# Patient Record
Sex: Female | Born: 1940 | Hispanic: No | Marital: Married | Smoking: Never smoker
Health system: Southern US, Community
[De-identification: ages and names within clinical notes are randomized; demographics above are authoritative.]

## PROBLEM LIST (undated history)

## (undated) DIAGNOSIS — I82409 Acute embolism and thrombosis of unspecified deep veins of unspecified lower extremity: Secondary | ICD-10-CM

## (undated) DIAGNOSIS — T8859XA Other complications of anesthesia, initial encounter: Secondary | ICD-10-CM

## (undated) DIAGNOSIS — M1990 Unspecified osteoarthritis, unspecified site: Secondary | ICD-10-CM

## (undated) DIAGNOSIS — M419 Scoliosis, unspecified: Secondary | ICD-10-CM

## (undated) DIAGNOSIS — I10 Essential (primary) hypertension: Secondary | ICD-10-CM

## (undated) DIAGNOSIS — K259 Gastric ulcer, unspecified as acute or chronic, without hemorrhage or perforation: Secondary | ICD-10-CM

## (undated) DIAGNOSIS — Z860100 Personal history of colon polyps, unspecified: Secondary | ICD-10-CM

## (undated) DIAGNOSIS — J45909 Unspecified asthma, uncomplicated: Secondary | ICD-10-CM

## (undated) DIAGNOSIS — K219 Gastro-esophageal reflux disease without esophagitis: Secondary | ICD-10-CM

## (undated) DIAGNOSIS — R011 Cardiac murmur, unspecified: Secondary | ICD-10-CM

## (undated) DIAGNOSIS — M8580 Other specified disorders of bone density and structure, unspecified site: Secondary | ICD-10-CM

## (undated) HISTORY — PX: APPENDECTOMY: SHX54

## (undated) HISTORY — PX: COLONOSCOPY: SHX174

## (undated) HISTORY — PX: KNEE SURGERY: SHX244

---

## 1997-07-13 ENCOUNTER — Ambulatory Visit (HOSPITAL_COMMUNITY): Admission: RE | Admit: 1997-07-13 | Discharge: 1997-07-13 | Payer: Self-pay | Admitting: Gastroenterology

## 1999-01-17 ENCOUNTER — Other Ambulatory Visit: Admission: RE | Admit: 1999-01-17 | Discharge: 1999-01-17 | Payer: Self-pay | Admitting: Internal Medicine

## 2000-01-17 ENCOUNTER — Other Ambulatory Visit: Admission: RE | Admit: 2000-01-17 | Discharge: 2000-01-17 | Payer: Self-pay | Admitting: Internal Medicine

## 2003-01-20 ENCOUNTER — Other Ambulatory Visit: Admission: RE | Admit: 2003-01-20 | Discharge: 2003-01-20 | Payer: Self-pay | Admitting: Internal Medicine

## 2006-01-18 ENCOUNTER — Other Ambulatory Visit: Admission: RE | Admit: 2006-01-18 | Discharge: 2006-01-18 | Payer: Self-pay | Admitting: Internal Medicine

## 2009-01-17 ENCOUNTER — Other Ambulatory Visit: Admission: RE | Admit: 2009-01-17 | Discharge: 2009-01-17 | Payer: Self-pay | Admitting: Internal Medicine

## 2014-07-19 ENCOUNTER — Ambulatory Visit
Admission: RE | Admit: 2014-07-19 | Discharge: 2014-07-19 | Disposition: A | Discharge status: Home / Self Care | Payer: Medicare Other | Source: Ambulatory Visit | Attending: Internal Medicine | Admitting: Internal Medicine

## 2014-07-19 ENCOUNTER — Other Ambulatory Visit: Payer: Self-pay | Admitting: Internal Medicine

## 2014-07-19 DIAGNOSIS — M79604 Pain in right leg: Secondary | ICD-10-CM

## 2014-07-19 DIAGNOSIS — M25551 Pain in right hip: Secondary | ICD-10-CM

## 2014-11-18 ENCOUNTER — Other Ambulatory Visit: Payer: Self-pay | Admitting: Internal Medicine

## 2014-11-18 DIAGNOSIS — M25561 Pain in right knee: Secondary | ICD-10-CM

## 2014-11-18 DIAGNOSIS — M25461 Effusion, right knee: Secondary | ICD-10-CM

## 2014-12-03 ENCOUNTER — Ambulatory Visit
Admission: RE | Admit: 2014-12-03 | Discharge: 2014-12-03 | Disposition: A | Discharge status: Home / Self Care | Payer: Medicare Other | Source: Ambulatory Visit | Attending: Internal Medicine | Admitting: Internal Medicine

## 2014-12-03 DIAGNOSIS — M25461 Effusion, right knee: Secondary | ICD-10-CM

## 2014-12-03 DIAGNOSIS — M25561 Pain in right knee: Secondary | ICD-10-CM

## 2015-02-17 DIAGNOSIS — I10 Essential (primary) hypertension: Secondary | ICD-10-CM | POA: Diagnosis not present

## 2015-02-22 DIAGNOSIS — R69 Illness, unspecified: Secondary | ICD-10-CM | POA: Diagnosis not present

## 2015-05-10 ENCOUNTER — Ambulatory Visit
Admission: RE | Admit: 2015-05-10 | Discharge: 2015-05-10 | Disposition: A | Discharge status: Home / Self Care | Payer: Medicare HMO | Source: Ambulatory Visit | Attending: Internal Medicine | Admitting: Internal Medicine

## 2015-05-10 ENCOUNTER — Other Ambulatory Visit: Payer: Self-pay | Admitting: Internal Medicine

## 2015-05-10 DIAGNOSIS — M1990 Unspecified osteoarthritis, unspecified site: Secondary | ICD-10-CM | POA: Diagnosis not present

## 2015-05-10 DIAGNOSIS — Z0000 Encounter for general adult medical examination without abnormal findings: Secondary | ICD-10-CM | POA: Diagnosis not present

## 2015-05-10 DIAGNOSIS — M1611 Unilateral primary osteoarthritis, right hip: Secondary | ICD-10-CM | POA: Diagnosis not present

## 2015-05-10 DIAGNOSIS — K51419 Inflammatory polyps of colon with unspecified complications: Secondary | ICD-10-CM | POA: Diagnosis not present

## 2015-05-10 DIAGNOSIS — R52 Pain, unspecified: Secondary | ICD-10-CM

## 2015-05-10 DIAGNOSIS — K210 Gastro-esophageal reflux disease with esophagitis: Secondary | ICD-10-CM | POA: Diagnosis not present

## 2015-05-10 DIAGNOSIS — I119 Hypertensive heart disease without heart failure: Secondary | ICD-10-CM | POA: Diagnosis not present

## 2015-05-16 DIAGNOSIS — Z1231 Encounter for screening mammogram for malignant neoplasm of breast: Secondary | ICD-10-CM | POA: Diagnosis not present

## 2015-05-23 DIAGNOSIS — M47819 Spondylosis without myelopathy or radiculopathy, site unspecified: Secondary | ICD-10-CM | POA: Diagnosis not present

## 2015-05-23 DIAGNOSIS — M179 Osteoarthritis of knee, unspecified: Secondary | ICD-10-CM | POA: Diagnosis not present

## 2015-05-23 DIAGNOSIS — Z8739 Personal history of other diseases of the musculoskeletal system and connective tissue: Secondary | ICD-10-CM | POA: Diagnosis not present

## 2015-05-23 DIAGNOSIS — M5410 Radiculopathy, site unspecified: Secondary | ICD-10-CM | POA: Diagnosis not present

## 2015-06-16 DIAGNOSIS — H538 Other visual disturbances: Secondary | ICD-10-CM | POA: Diagnosis not present

## 2015-06-16 DIAGNOSIS — H25013 Cortical age-related cataract, bilateral: Secondary | ICD-10-CM | POA: Diagnosis not present

## 2015-10-11 DIAGNOSIS — I10 Essential (primary) hypertension: Secondary | ICD-10-CM | POA: Diagnosis not present

## 2015-10-11 DIAGNOSIS — M1990 Unspecified osteoarthritis, unspecified site: Secondary | ICD-10-CM | POA: Diagnosis not present

## 2015-10-11 DIAGNOSIS — Z23 Encounter for immunization: Secondary | ICD-10-CM | POA: Diagnosis not present

## 2015-11-30 DIAGNOSIS — M1711 Unilateral primary osteoarthritis, right knee: Secondary | ICD-10-CM | POA: Diagnosis not present

## 2015-11-30 DIAGNOSIS — M25561 Pain in right knee: Secondary | ICD-10-CM | POA: Diagnosis not present

## 2015-11-30 DIAGNOSIS — M25551 Pain in right hip: Secondary | ICD-10-CM | POA: Diagnosis not present

## 2015-11-30 DIAGNOSIS — M1611 Unilateral primary osteoarthritis, right hip: Secondary | ICD-10-CM | POA: Diagnosis not present

## 2015-12-08 DIAGNOSIS — Z6825 Body mass index (BMI) 25.0-25.9, adult: Secondary | ICD-10-CM | POA: Diagnosis not present

## 2015-12-08 DIAGNOSIS — J310 Chronic rhinitis: Secondary | ICD-10-CM | POA: Diagnosis not present

## 2015-12-08 DIAGNOSIS — M5416 Radiculopathy, lumbar region: Secondary | ICD-10-CM | POA: Diagnosis not present

## 2015-12-15 DIAGNOSIS — M5416 Radiculopathy, lumbar region: Secondary | ICD-10-CM | POA: Diagnosis not present

## 2016-01-02 DIAGNOSIS — M5416 Radiculopathy, lumbar region: Secondary | ICD-10-CM | POA: Diagnosis not present

## 2016-01-31 DIAGNOSIS — M5416 Radiculopathy, lumbar region: Secondary | ICD-10-CM | POA: Diagnosis not present

## 2016-02-01 DIAGNOSIS — I10 Essential (primary) hypertension: Secondary | ICD-10-CM | POA: Diagnosis not present

## 2016-02-01 DIAGNOSIS — M5416 Radiculopathy, lumbar region: Secondary | ICD-10-CM | POA: Diagnosis not present

## 2016-02-01 DIAGNOSIS — Z6826 Body mass index (BMI) 26.0-26.9, adult: Secondary | ICD-10-CM | POA: Diagnosis not present

## 2016-02-21 DIAGNOSIS — R69 Illness, unspecified: Secondary | ICD-10-CM | POA: Diagnosis not present

## 2016-02-21 DIAGNOSIS — M5416 Radiculopathy, lumbar region: Secondary | ICD-10-CM | POA: Diagnosis not present

## 2016-02-24 DIAGNOSIS — M5416 Radiculopathy, lumbar region: Secondary | ICD-10-CM | POA: Diagnosis not present

## 2016-02-28 DIAGNOSIS — M5416 Radiculopathy, lumbar region: Secondary | ICD-10-CM | POA: Diagnosis not present

## 2016-03-02 DIAGNOSIS — M5416 Radiculopathy, lumbar region: Secondary | ICD-10-CM | POA: Diagnosis not present

## 2016-03-08 DIAGNOSIS — J45901 Unspecified asthma with (acute) exacerbation: Secondary | ICD-10-CM | POA: Diagnosis not present

## 2016-03-08 DIAGNOSIS — M1990 Unspecified osteoarthritis, unspecified site: Secondary | ICD-10-CM | POA: Diagnosis not present

## 2016-03-08 DIAGNOSIS — I10 Essential (primary) hypertension: Secondary | ICD-10-CM | POA: Diagnosis not present

## 2016-03-08 DIAGNOSIS — K219 Gastro-esophageal reflux disease without esophagitis: Secondary | ICD-10-CM | POA: Diagnosis not present

## 2016-04-19 DIAGNOSIS — M5416 Radiculopathy, lumbar region: Secondary | ICD-10-CM | POA: Diagnosis not present

## 2016-04-19 DIAGNOSIS — I10 Essential (primary) hypertension: Secondary | ICD-10-CM | POA: Diagnosis not present

## 2016-04-19 DIAGNOSIS — Z6826 Body mass index (BMI) 26.0-26.9, adult: Secondary | ICD-10-CM | POA: Diagnosis not present

## 2016-04-24 DIAGNOSIS — M25561 Pain in right knee: Secondary | ICD-10-CM | POA: Diagnosis not present

## 2016-04-24 DIAGNOSIS — M7050 Other bursitis of knee, unspecified knee: Secondary | ICD-10-CM | POA: Diagnosis not present

## 2016-05-09 DIAGNOSIS — M7050 Other bursitis of knee, unspecified knee: Secondary | ICD-10-CM | POA: Diagnosis not present

## 2016-05-09 DIAGNOSIS — M25561 Pain in right knee: Secondary | ICD-10-CM | POA: Diagnosis not present

## 2016-05-09 DIAGNOSIS — M1711 Unilateral primary osteoarthritis, right knee: Secondary | ICD-10-CM | POA: Diagnosis not present

## 2016-05-10 DIAGNOSIS — I10 Essential (primary) hypertension: Secondary | ICD-10-CM | POA: Diagnosis not present

## 2016-05-10 DIAGNOSIS — Z0000 Encounter for general adult medical examination without abnormal findings: Secondary | ICD-10-CM | POA: Diagnosis not present

## 2016-05-10 DIAGNOSIS — M1990 Unspecified osteoarthritis, unspecified site: Secondary | ICD-10-CM | POA: Diagnosis not present

## 2016-05-10 DIAGNOSIS — Z6841 Body Mass Index (BMI) 40.0 and over, adult: Secondary | ICD-10-CM | POA: Diagnosis not present

## 2016-05-10 DIAGNOSIS — K219 Gastro-esophageal reflux disease without esophagitis: Secondary | ICD-10-CM | POA: Diagnosis not present

## 2016-05-15 DIAGNOSIS — R799 Abnormal finding of blood chemistry, unspecified: Secondary | ICD-10-CM | POA: Diagnosis not present

## 2016-05-15 DIAGNOSIS — I10 Essential (primary) hypertension: Secondary | ICD-10-CM | POA: Diagnosis not present

## 2016-05-21 DIAGNOSIS — M810 Age-related osteoporosis without current pathological fracture: Secondary | ICD-10-CM | POA: Diagnosis not present

## 2016-05-21 DIAGNOSIS — M8589 Other specified disorders of bone density and structure, multiple sites: Secondary | ICD-10-CM | POA: Diagnosis not present

## 2016-05-21 DIAGNOSIS — Z1231 Encounter for screening mammogram for malignant neoplasm of breast: Secondary | ICD-10-CM | POA: Diagnosis not present

## 2016-06-13 DIAGNOSIS — M25561 Pain in right knee: Secondary | ICD-10-CM | POA: Diagnosis not present

## 2016-06-13 DIAGNOSIS — M179 Osteoarthritis of knee, unspecified: Secondary | ICD-10-CM | POA: Diagnosis not present

## 2016-06-13 DIAGNOSIS — M2341 Loose body in knee, right knee: Secondary | ICD-10-CM | POA: Diagnosis not present

## 2016-06-13 DIAGNOSIS — G8929 Other chronic pain: Secondary | ICD-10-CM | POA: Diagnosis not present

## 2016-07-03 DIAGNOSIS — M2341 Loose body in knee, right knee: Secondary | ICD-10-CM | POA: Diagnosis not present

## 2016-07-03 DIAGNOSIS — M179 Osteoarthritis of knee, unspecified: Secondary | ICD-10-CM | POA: Diagnosis not present

## 2016-07-06 DIAGNOSIS — M25861 Other specified joint disorders, right knee: Secondary | ICD-10-CM | POA: Diagnosis not present

## 2016-07-06 DIAGNOSIS — R2241 Localized swelling, mass and lump, right lower limb: Secondary | ICD-10-CM | POA: Diagnosis not present

## 2016-07-06 DIAGNOSIS — S83241A Other tear of medial meniscus, current injury, right knee, initial encounter: Secondary | ICD-10-CM | POA: Diagnosis not present

## 2016-07-06 DIAGNOSIS — K219 Gastro-esophageal reflux disease without esophagitis: Secondary | ICD-10-CM | POA: Diagnosis not present

## 2016-07-06 DIAGNOSIS — G8929 Other chronic pain: Secondary | ICD-10-CM | POA: Diagnosis not present

## 2016-07-06 DIAGNOSIS — M25561 Pain in right knee: Secondary | ICD-10-CM | POA: Diagnosis not present

## 2016-07-06 DIAGNOSIS — S83281A Other tear of lateral meniscus, current injury, right knee, initial encounter: Secondary | ICD-10-CM | POA: Diagnosis not present

## 2016-07-06 DIAGNOSIS — Z79899 Other long term (current) drug therapy: Secondary | ICD-10-CM | POA: Diagnosis not present

## 2016-07-06 DIAGNOSIS — X58XXXA Exposure to other specified factors, initial encounter: Secondary | ICD-10-CM | POA: Diagnosis not present

## 2016-07-06 DIAGNOSIS — M2341 Loose body in knee, right knee: Secondary | ICD-10-CM | POA: Diagnosis not present

## 2016-07-06 DIAGNOSIS — I10 Essential (primary) hypertension: Secondary | ICD-10-CM | POA: Diagnosis not present

## 2016-07-06 DIAGNOSIS — M1711 Unilateral primary osteoarthritis, right knee: Secondary | ICD-10-CM | POA: Diagnosis not present

## 2016-07-19 DIAGNOSIS — M2341 Loose body in knee, right knee: Secondary | ICD-10-CM | POA: Diagnosis not present

## 2016-07-19 DIAGNOSIS — M179 Osteoarthritis of knee, unspecified: Secondary | ICD-10-CM | POA: Diagnosis not present

## 2016-08-15 DIAGNOSIS — M25861 Other specified joint disorders, right knee: Secondary | ICD-10-CM | POA: Diagnosis not present

## 2016-08-20 DIAGNOSIS — M1990 Unspecified osteoarthritis, unspecified site: Secondary | ICD-10-CM | POA: Diagnosis not present

## 2016-08-20 DIAGNOSIS — I10 Essential (primary) hypertension: Secondary | ICD-10-CM | POA: Diagnosis not present

## 2016-09-26 DIAGNOSIS — G8929 Other chronic pain: Secondary | ICD-10-CM | POA: Diagnosis not present

## 2016-09-26 DIAGNOSIS — Z9889 Other specified postprocedural states: Secondary | ICD-10-CM | POA: Diagnosis not present

## 2016-09-26 DIAGNOSIS — D481 Neoplasm of uncertain behavior of connective and other soft tissue: Secondary | ICD-10-CM | POA: Diagnosis not present

## 2016-09-26 DIAGNOSIS — M25561 Pain in right knee: Secondary | ICD-10-CM | POA: Diagnosis not present

## 2016-10-02 DIAGNOSIS — I10 Essential (primary) hypertension: Secondary | ICD-10-CM | POA: Diagnosis not present

## 2016-10-02 DIAGNOSIS — R799 Abnormal finding of blood chemistry, unspecified: Secondary | ICD-10-CM | POA: Diagnosis not present

## 2016-10-02 DIAGNOSIS — Z23 Encounter for immunization: Secondary | ICD-10-CM | POA: Diagnosis not present

## 2016-10-02 DIAGNOSIS — R945 Abnormal results of liver function studies: Secondary | ICD-10-CM | POA: Diagnosis not present

## 2016-10-02 DIAGNOSIS — M1990 Unspecified osteoarthritis, unspecified site: Secondary | ICD-10-CM | POA: Diagnosis not present

## 2016-10-09 ENCOUNTER — Other Ambulatory Visit: Payer: Self-pay | Admitting: Internal Medicine

## 2016-10-09 DIAGNOSIS — R7989 Other specified abnormal findings of blood chemistry: Secondary | ICD-10-CM

## 2016-10-09 DIAGNOSIS — R945 Abnormal results of liver function studies: Principal | ICD-10-CM

## 2016-10-11 ENCOUNTER — Other Ambulatory Visit: Payer: Medicare HMO

## 2016-10-12 DIAGNOSIS — K828 Other specified diseases of gallbladder: Secondary | ICD-10-CM | POA: Diagnosis not present

## 2016-10-12 DIAGNOSIS — R945 Abnormal results of liver function studies: Secondary | ICD-10-CM | POA: Diagnosis not present

## 2016-10-12 DIAGNOSIS — R7989 Other specified abnormal findings of blood chemistry: Secondary | ICD-10-CM | POA: Diagnosis not present

## 2016-10-12 DIAGNOSIS — K838 Other specified diseases of biliary tract: Secondary | ICD-10-CM | POA: Diagnosis not present

## 2016-10-17 DIAGNOSIS — R69 Illness, unspecified: Secondary | ICD-10-CM | POA: Diagnosis not present

## 2016-10-23 DIAGNOSIS — R945 Abnormal results of liver function studies: Secondary | ICD-10-CM | POA: Diagnosis not present

## 2016-10-23 DIAGNOSIS — K829 Disease of gallbladder, unspecified: Secondary | ICD-10-CM | POA: Diagnosis not present

## 2016-10-23 DIAGNOSIS — K838 Other specified diseases of biliary tract: Secondary | ICD-10-CM | POA: Diagnosis not present

## 2016-10-25 DIAGNOSIS — R945 Abnormal results of liver function studies: Secondary | ICD-10-CM | POA: Diagnosis not present

## 2016-10-25 DIAGNOSIS — I10 Essential (primary) hypertension: Secondary | ICD-10-CM | POA: Diagnosis not present

## 2016-11-14 DIAGNOSIS — K5730 Diverticulosis of large intestine without perforation or abscess without bleeding: Secondary | ICD-10-CM | POA: Diagnosis not present

## 2016-11-14 DIAGNOSIS — R933 Abnormal findings on diagnostic imaging of other parts of digestive tract: Secondary | ICD-10-CM | POA: Diagnosis not present

## 2016-11-14 DIAGNOSIS — R740 Nonspecific elevation of levels of transaminase and lactic acid dehydrogenase [LDH]: Secondary | ICD-10-CM | POA: Diagnosis not present

## 2016-12-24 DIAGNOSIS — R740 Nonspecific elevation of levels of transaminase and lactic acid dehydrogenase [LDH]: Secondary | ICD-10-CM | POA: Diagnosis not present

## 2016-12-24 DIAGNOSIS — R933 Abnormal findings on diagnostic imaging of other parts of digestive tract: Secondary | ICD-10-CM | POA: Diagnosis not present

## 2016-12-26 ENCOUNTER — Other Ambulatory Visit: Payer: Self-pay | Admitting: Gastroenterology

## 2016-12-27 ENCOUNTER — Encounter (HOSPITAL_COMMUNITY): Payer: Self-pay | Admitting: Emergency Medicine

## 2016-12-27 ENCOUNTER — Other Ambulatory Visit: Payer: Self-pay

## 2017-01-01 ENCOUNTER — Other Ambulatory Visit: Payer: Self-pay

## 2017-01-01 ENCOUNTER — Ambulatory Visit (HOSPITAL_COMMUNITY): Payer: Medicare HMO | Admitting: Certified Registered Nurse Anesthetist

## 2017-01-01 ENCOUNTER — Encounter (HOSPITAL_COMMUNITY): Payer: Self-pay

## 2017-01-01 ENCOUNTER — Ambulatory Visit (HOSPITAL_COMMUNITY)
Admission: RE | Admit: 2017-01-01 | Discharge: 2017-01-01 | Disposition: A | Discharge status: Home / Self Care | Payer: Medicare HMO | Source: Ambulatory Visit | Attending: Gastroenterology | Admitting: Gastroenterology

## 2017-01-01 ENCOUNTER — Encounter (HOSPITAL_COMMUNITY)
Admission: RE | Disposition: A | Discharge status: Home / Self Care | Payer: Self-pay | Source: Ambulatory Visit | Attending: Gastroenterology

## 2017-01-01 DIAGNOSIS — R932 Abnormal findings on diagnostic imaging of liver and biliary tract: Secondary | ICD-10-CM | POA: Insufficient documentation

## 2017-01-01 DIAGNOSIS — R933 Abnormal findings on diagnostic imaging of other parts of digestive tract: Secondary | ICD-10-CM | POA: Diagnosis not present

## 2017-01-01 DIAGNOSIS — K838 Other specified diseases of biliary tract: Secondary | ICD-10-CM | POA: Diagnosis not present

## 2017-01-01 DIAGNOSIS — I10 Essential (primary) hypertension: Secondary | ICD-10-CM | POA: Diagnosis not present

## 2017-01-01 DIAGNOSIS — K219 Gastro-esophageal reflux disease without esophagitis: Secondary | ICD-10-CM | POA: Diagnosis not present

## 2017-01-01 DIAGNOSIS — Z79899 Other long term (current) drug therapy: Secondary | ICD-10-CM | POA: Insufficient documentation

## 2017-01-01 HISTORY — DX: Essential (primary) hypertension: I10

## 2017-01-01 HISTORY — PX: EUS: SHX5427

## 2017-01-01 SURGERY — UPPER ENDOSCOPIC ULTRASOUND (EUS) LINEAR
Anesthesia: Monitor Anesthesia Care

## 2017-01-01 MED ORDER — LACTATED RINGERS IV SOLN
INTRAVENOUS | Status: DC
  Administered 2017-01-01: 14:00:00 via INTRAVENOUS

## 2017-01-01 MED ORDER — ONDANSETRON HCL 4 MG/2ML IJ SOLN
INTRAMUSCULAR | Status: DC | PRN
  Administered 2017-01-01: 4 mg via INTRAVENOUS

## 2017-01-01 MED ORDER — PROPOFOL 500 MG/50ML IV EMUL
INTRAVENOUS | Status: DC | PRN
  Administered 2017-01-01: 150 ug/kg/min via INTRAVENOUS

## 2017-01-01 MED ORDER — PROPOFOL 10 MG/ML IV BOLUS
INTRAVENOUS | Status: DC | PRN
  Administered 2017-01-01 (×2): 20 mg via INTRAVENOUS
  Administered 2017-01-01: 10 mg via INTRAVENOUS
  Administered 2017-01-01: 20 mg via INTRAVENOUS
  Administered 2017-01-01: 50 mg via INTRAVENOUS
  Administered 2017-01-01: 20 mg via INTRAVENOUS
  Administered 2017-01-01: 10 mg via INTRAVENOUS

## 2017-01-01 MED ORDER — LIDOCAINE 2% (20 MG/ML) 5 ML SYRINGE
INTRAMUSCULAR | Status: DC | PRN
  Administered 2017-01-01: 100 mg via INTRAVENOUS

## 2017-01-01 MED ORDER — SODIUM CHLORIDE 0.9 % IV SOLN: INTRAVENOUS | Status: DC

## 2017-01-01 MED ORDER — PROPOFOL 10 MG/ML IV BOLUS
INTRAVENOUS | Status: AC
  Filled 2017-01-01: qty 20

## 2017-01-01 NOTE — Anesthesia Preprocedure Evaluation (Signed)
Anesthesia Evaluation  Patient identified by MRN, date of birth, ID band Patient awake    Reviewed: Allergy & Precautions, NPO status , Patient's Chart, lab work & pertinent test results, reviewed documented beta blocker date and time   Airway Mallampati: II  TM Distance: >3 FB Neck ROM: Full    Dental  (+) Teeth Intact, Dental Advisory Given   Pulmonary neg pulmonary ROS,    Pulmonary exam normal breath sounds clear to auscultation       Cardiovascular hypertension, Pt. on home beta blockers and Pt. on medications Normal cardiovascular exam Rhythm:Regular Rate:Normal     Neuro/Psych negative neurological ROS  negative psych ROS   GI/Hepatic GERD  Medicated,Dilated bile ducts   Endo/Other  negative endocrine ROS  Renal/GU negative Renal ROS     Musculoskeletal negative musculoskeletal ROS (+)   Abdominal   Peds  Hematology negative hematology ROS (+)   Anesthesia Other Findings Day of surgery medications reviewed with the patient.  Reproductive/Obstetrics                             Anesthesia Physical Anesthesia Plan  ASA: II  Anesthesia Plan: MAC   Post-op Pain Management:    Induction: Intravenous  PONV Risk Score and Plan: 2 and Propofol infusion, Treatment may vary due to age or medical condition and Ondansetron  Airway Management Planned: Nasal Cannula  Additional Equipment:   Intra-op Plan:   Post-operative Plan:   Informed Consent: I have reviewed the patients History and Physical, chart, labs and discussed the procedure including the risks, benefits and alternatives for the proposed anesthesia with the patient or authorized representative who has indicated his/her understanding and acceptance.   Dental advisory given  Plan Discussed with: CRNA and Anesthesiologist  Anesthesia Plan Comments: (Discussed risks/benefits/alternatives to MAC sedation including need for  ventilatory support, hypotension, need for conversion to general anesthesia.  All patient questions answered.  Patient/guardian wishes to proceed.)        Anesthesia Quick Evaluation

## 2017-01-01 NOTE — Transfer of Care (Signed)
Immediate Anesthesia Transfer of Care Note  Patient: Deanna Wright  Procedure(s) Performed: Procedure(s): UPPER ENDOSCOPIC ULTRASOUND (EUS) LINEAR (N/A)  Patient Location: PACU  Anesthesia Type:MAC  Level of Consciousness: Patient easily awoken, sedated, comfortable, cooperative, following commands, responds to stimulation.   Airway & Oxygen Therapy: Patient spontaneously breathing, ventilating well, oxygen via simple oxygen mask.  Post-op Assessment: Report given to PACU RN, vital signs reviewed and stable, moving all extremities.   Post vital signs: Reviewed and stable.  Complications: No apparent anesthesia complications  Last Vitals:  Vitals:   01/01/17 1259  BP: (!) 135/54  Pulse: 61  Resp: 12  Temp: 36.8 C  SpO2: 100%    Last Pain:  Vitals:   01/01/17 1259  TempSrc: Oral  PainSc: 6          Complications: No apparent anesthesia complications

## 2017-01-01 NOTE — Op Note (Addendum)
[facility] Patient Name: Deanna Wright Procedure Date: 01/01/2017 MRN: [id number] Attending MD: Carol Ada , MD Date of Birth: [birth year] CSN: [id number] Age: 76 Admit Type: Outpatient Procedure:                Upper EUS Indications:              Common bile duct dilation (etiology unknown) seen                            on CT scan Providers:                Carol Ada, MD, Zenon Mayo, RN, Elspeth Cho                            Tech., Technician, Heide Scales, CRNA Referring MD:              Medicines:                Propofol per Anesthesia Complications:            No immediate complications. Estimated Blood Loss:     Estimated blood loss was minimal. Procedure:                Pre-Anesthesia Assessment:                           - Prior to the procedure, a History and Physical                            was performed, and patient medications and                            allergies were reviewed. The patient's tolerance of                            previous anesthesia was also reviewed. The risks                            and benefits of the procedure and the sedation                            options and risks were discussed with the patient.                            All questions were answered, and informed consent                            was obtained. Prior Anticoagulants: The patient has                            taken no previous anticoagulant or antiplatelet                            agents. ASA Grade Assessment: II - A patient with  mild systemic disease. After reviewing the risks                            and benefits, the patient was deemed in                            satisfactory condition to undergo the procedure.                           - Sedation was administered by an anesthesia                            professional. Deep sedation was attained.                           After obtaining informed  consent, the endoscope was                            passed under direct vision. Throughout the                            procedure, the patient's blood pressure, pulse, and                            oxygen saturations were monitored continuously. The                            [id number] ([id number]) scope was introduced through                            the mouth, and advanced to the second part of                            duodenum. The upper EUS was accomplished without                            difficulty. The patient tolerated the procedure                            well. Scope In: Scope Out: Findings:      Endosonographic Finding :      There was dilation in the main bile duct which measured up to 10 mm with       a distal oval lesion measuring 8.2 mm.      From the second portion of the duodenum the distal CBD was visualized       with a low insertion of the cystic duct. An oval mildly hyperechoic       lesion measuring 8.2 mm was identified. There was no other abnormality       identified in the distal CBD and this did not appear to be mobile or a       stone. No shadowing was identified. The entire pancreas was normal as       well as the PD diameter. No significant intrahepatic biliary ductal       dilation was found. The left  adrenal gland was normal, as well as the       left kidney, and spleen. Aortic atherosclerosis was noted. Impression:               - There was dilation in the entire main bile duct                            which measured up to 10 mm.                           - No specimens collected. Moderate Sedation:      N/A- Per Anesthesia Care Recommendation:           - Patient has a contact number available for                            emergencies. The signs and symptoms of potential                            delayed complications were discussed with the                            patient. Return to normal activities tomorrow.                             Written discharge instructions were provided to the                            patient.                           - Resume regular diet.                           - Perform an ERCP with [facility] at the next                            available appointment. Procedure Code(s):        --- Professional ---                           [phone], Esophagogastroduodenoscopy, flexible,                            transoral; with endoscopic ultrasound examination                            limited to the esophagus, stomach or duodenum, and                            adjacent structures Diagnosis Code(s):        --- Professional ---                           K83.8, Other specified diseases of biliary tract  R93.2, Abnormal findings on diagnostic imaging of                            liver and biliary tract CPT copyright 2016 [employer]. All rights reserved. The codes documented in this report are preliminary and upon coder review may  be revised to meet current compliance requirements. Carol Ada, MD Carol Ada, MD 01/01/2017 2:43:01 PM This report has been signed electronically. Number of Addenda: 0

## 2017-01-01 NOTE — Anesthesia Procedure Notes (Signed)
Procedure Name: MAC Date/Time: 01/01/2017 2:10 PM Performed by: Deliah Boston, CRNA Pre-anesthesia Checklist: Patient identified, Emergency Drugs available, Suction available and Patient being monitored Patient Re-evaluated:Patient Re-evaluated prior to induction Oxygen Delivery Method: Nasal cannula Placement Confirmation: positive ETCO2 and CO2 detector

## 2017-01-01 NOTE — Discharge Instructions (Signed)

## 2017-01-01 NOTE — H&P (View-Only) (Signed)
Deanna Wright HPI: The patient was noted to have a dilated CBD and intrahepatic ducts with an elevated AP in the 300 range.  Her liver panel was improving, but her AP started to increase again.  Her AST and ALT normalized.  She does not have any abdominal pain and her TB was normal.  She still has an intact gallbladder.  Past Medical History:  Diagnosis Date  . Hypertension     Past Surgical History:  Procedure Laterality Date  . APPENDECTOMY    . KNEE SURGERY      History reviewed. No pertinent family history.  Social History:  reports that  has never smoked. She does not have any smokeless tobacco history on file. She reports that she does not drink alcohol or use drugs.  Allergies:  Allergies  Allergen Reactions  . Other Other (See Comments)    MD advised to not take muscle relaxers  . Stadol [Butorphanol] Nausea And Vomiting and Other (See Comments)    BP bottoms out    Medications: Scheduled: Continuous:  No results found for this or any previous visit (from the past 24 hour(s)).   No results found.  ROS:  As stated above in the HPI otherwise negative.  There were no vitals taken for this visit.    PE: Gen: NAD, Alert and Oriented HEENT:  [place]/AT, EOMI Neck: Supple, no LAD Lungs: CTA Bilaterally CV: RRR without M/G/R ABM: Soft, NTND, +BS Ext: No C/C/E  Assessment/Plan: 1) Dilated CBD. 2) Elevated AP.  Plan: 1) EUS.  Deanna Wright D 01/01/2017, 1:04 PM

## 2017-01-01 NOTE — H&P (Signed)
Chrisandra Carota HPI: The patient was noted to have a dilated CBD and intrahepatic ducts with an elevated AP in the 300 range.  Her liver panel was improving, but her AP started to increase again.  Her AST and ALT normalized.  She does not have any abdominal pain and her TB was normal.  She still has an intact gallbladder.  Past Medical History:  Diagnosis Date  . Hypertension     Past Surgical History:  Procedure Laterality Date  . APPENDECTOMY    . KNEE SURGERY      History reviewed. No pertinent family history.  Social History:  reports that  has never smoked. She does not have any smokeless tobacco history on file. She reports that she does not drink alcohol or use drugs.  Allergies:  Allergies  Allergen Reactions  . Other Other (See Comments)    MD advised to not take muscle relaxers  . Stadol [Butorphanol] Nausea And Vomiting and Other (See Comments)    BP bottoms out    Medications: Scheduled: Continuous:  No results found for this or any previous visit (from the past 24 hour(s)).   No results found.  ROS:  As stated above in the HPI otherwise negative.  There were no vitals taken for this visit.    PE: Gen: NAD, Alert and Oriented HEENT:  [place]/AT, EOMI Neck: Supple, no LAD Lungs: CTA Bilaterally CV: RRR without M/G/R ABM: Soft, NTND, +BS Ext: No C/C/E  Assessment/Plan: 1) Dilated CBD. 2) Elevated AP.  Plan: 1) EUS.  Shaylee Stanislawski D 01/01/2017, 1:04 PM

## 2017-01-02 ENCOUNTER — Encounter (HOSPITAL_COMMUNITY): Payer: Self-pay | Admitting: Gastroenterology

## 2017-01-02 NOTE — Anesthesia Postprocedure Evaluation (Signed)
Anesthesia Post Note  Patient: Deanna Wright  Procedure(s) Performed: UPPER ENDOSCOPIC ULTRASOUND (EUS) LINEAR (N/A )     Patient location during evaluation: Endoscopy Anesthesia Type: MAC Level of consciousness: awake and alert Pain management: pain level controlled Vital Signs Assessment: post-procedure vital signs reviewed and stable Respiratory status: spontaneous breathing, nonlabored ventilation and respiratory function stable Cardiovascular status: stable and blood pressure returned to baseline Postop Assessment: no apparent nausea or vomiting Anesthetic complications: no    Last Vitals:  Vitals:   01/01/17 1440 01/01/17 1450  BP: (!) 125/42 (!) 116/96  Pulse: (!) 56 62  Resp: 13 12  Temp:    SpO2: 99% 100%    Last Pain:  Vitals:   01/01/17 1440  TempSrc:   PainSc: [place]

## 2017-01-03 ENCOUNTER — Other Ambulatory Visit: Payer: Self-pay | Admitting: Gastroenterology

## 2017-01-17 ENCOUNTER — Ambulatory Visit (HOSPITAL_COMMUNITY): Payer: Medicare HMO | Admitting: Certified Registered Nurse Anesthetist

## 2017-01-17 ENCOUNTER — Encounter (HOSPITAL_COMMUNITY)
Admission: RE | Disposition: A | Discharge status: Home / Self Care | Payer: Self-pay | Source: Ambulatory Visit | Attending: Gastroenterology

## 2017-01-17 ENCOUNTER — Ambulatory Visit (HOSPITAL_COMMUNITY): Payer: Medicare HMO

## 2017-01-17 ENCOUNTER — Other Ambulatory Visit: Payer: Self-pay

## 2017-01-17 ENCOUNTER — Ambulatory Visit (HOSPITAL_COMMUNITY)
Admission: RE | Admit: 2017-01-17 | Discharge: 2017-01-17 | Disposition: A | Discharge status: Home / Self Care | Payer: Medicare HMO | Source: Ambulatory Visit | Attending: Gastroenterology | Admitting: Gastroenterology

## 2017-01-17 ENCOUNTER — Encounter (HOSPITAL_COMMUNITY): Payer: Self-pay

## 2017-01-17 DIAGNOSIS — K838 Other specified diseases of biliary tract: Secondary | ICD-10-CM | POA: Diagnosis not present

## 2017-01-17 DIAGNOSIS — R933 Abnormal findings on diagnostic imaging of other parts of digestive tract: Secondary | ICD-10-CM | POA: Diagnosis not present

## 2017-01-17 DIAGNOSIS — Z79899 Other long term (current) drug therapy: Secondary | ICD-10-CM | POA: Diagnosis not present

## 2017-01-17 DIAGNOSIS — D135 Benign neoplasm of extrahepatic bile ducts: Secondary | ICD-10-CM | POA: Insufficient documentation

## 2017-01-17 DIAGNOSIS — I10 Essential (primary) hypertension: Secondary | ICD-10-CM | POA: Insufficient documentation

## 2017-01-17 DIAGNOSIS — Z7982 Long term (current) use of aspirin: Secondary | ICD-10-CM | POA: Diagnosis not present

## 2017-01-17 DIAGNOSIS — K831 Obstruction of bile duct: Secondary | ICD-10-CM | POA: Diagnosis not present

## 2017-01-17 HISTORY — PX: ENDOSCOPIC RETROGRADE CHOLANGIOPANCREATOGRAPHY (ERCP) WITH PROPOFOL: SHX5810

## 2017-01-17 SURGERY — ENDOSCOPIC RETROGRADE CHOLANGIOPANCREATOGRAPHY (ERCP) WITH PROPOFOL
Anesthesia: General

## 2017-01-17 MED ORDER — INDOMETHACIN 50 MG RE SUPP
RECTAL | Status: DC | PRN
  Administered 2017-01-17: 100 mg via RECTAL

## 2017-01-17 MED ORDER — SUCCINYLCHOLINE CHLORIDE 200 MG/10ML IV SOSY
PREFILLED_SYRINGE | INTRAVENOUS | Status: DC | PRN
  Administered 2017-01-17: 100 mg via INTRAVENOUS

## 2017-01-17 MED ORDER — FENTANYL CITRATE (PF) 100 MCG/2ML IJ SOLN
INTRAMUSCULAR | Status: AC
  Filled 2017-01-17: qty 2

## 2017-01-17 MED ORDER — GLUCAGON HCL RDNA (DIAGNOSTIC) 1 MG IJ SOLR
INTRAMUSCULAR | Status: AC
  Filled 2017-01-17: qty 1

## 2017-01-17 MED ORDER — PROPOFOL 10 MG/ML IV BOLUS
INTRAVENOUS | Status: AC
  Filled 2017-01-17: qty 20

## 2017-01-17 MED ORDER — FENTANYL CITRATE (PF) 100 MCG/2ML IJ SOLN
INTRAMUSCULAR | Status: DC | PRN
  Administered 2017-01-17: 50 ug via INTRAVENOUS

## 2017-01-17 MED ORDER — LIDOCAINE 2% (20 MG/ML) 5 ML SYRINGE
INTRAMUSCULAR | Status: DC | PRN
  Administered 2017-01-17: 40 mg via INTRAVENOUS

## 2017-01-17 MED ORDER — CIPROFLOXACIN IN D5W 400 MG/200ML IV SOLN
INTRAVENOUS | Status: DC | PRN
  Administered 2017-01-17: 400 mg via INTRAVENOUS

## 2017-01-17 MED ORDER — DEXAMETHASONE SODIUM PHOSPHATE 10 MG/ML IJ SOLN
INTRAMUSCULAR | Status: DC | PRN
  Administered 2017-01-17: 10 mg via INTRAVENOUS

## 2017-01-17 MED ORDER — ONDANSETRON HCL 4 MG/2ML IJ SOLN
INTRAMUSCULAR | Status: DC | PRN
  Administered 2017-01-17: 4 mg via INTRAVENOUS

## 2017-01-17 MED ORDER — CIPROFLOXACIN IN D5W 400 MG/200ML IV SOLN
INTRAVENOUS | Status: AC
  Filled 2017-01-17: qty 200

## 2017-01-17 MED ORDER — INDOMETHACIN 50 MG RE SUPP
RECTAL | Status: AC
  Filled 2017-01-17: qty 2

## 2017-01-17 MED ORDER — EPHEDRINE SULFATE 50 MG/ML IJ SOLN
INTRAMUSCULAR | Status: DC | PRN
  Administered 2017-01-17 (×2): 5 mg via INTRAVENOUS

## 2017-01-17 MED ORDER — PROPOFOL 10 MG/ML IV BOLUS
INTRAVENOUS | Status: DC | PRN
  Administered 2017-01-17: 20 mg via INTRAVENOUS
  Administered 2017-01-17: 90 mg via INTRAVENOUS

## 2017-01-17 MED ORDER — IOPAMIDOL (ISOVUE-M 300) INJECTION 61%
INTRAMUSCULAR | Status: DC | PRN
  Administered 2017-01-17: 10 mL via INTRATHECAL

## 2017-01-17 MED ORDER — LACTATED RINGERS IV SOLN
INTRAVENOUS | Status: DC
  Administered 2017-01-17: 12:00:00 via INTRAVENOUS

## 2017-01-17 NOTE — Transfer of Care (Signed)
Immediate Anesthesia Transfer of Care Note  Patient: Deanna Wright  Procedure(s) Performed: ENDOSCOPIC RETROGRADE CHOLANGIOPANCREATOGRAPHY (ERCP) WITH PROPOFOL (N/A )  Patient Location: PACU  Anesthesia Type:General  Level of Consciousness: drowsy and patient cooperative  Airway & Oxygen Therapy: Patient Spontanous Breathing and Patient connected to face mask oxygen  Post-op Assessment: Report given to RN, Post -op Vital signs reviewed and stable and Patient moving all extremities X 4  Post vital signs: Reviewed and stable  Last Vitals:  Vitals:   01/17/17 1148  BP: (!) 163/66  Pulse: 61  Resp: 17  Temp: 36.8 C    Last Pain:  Vitals:   01/17/17 1148  TempSrc: Oral         Complications: No apparent anesthesia complications

## 2017-01-17 NOTE — Anesthesia Procedure Notes (Signed)
Procedure Name: Intubation Date/Time: 01/17/2017 1:13 PM Performed by: West Pugh, CRNA Pre-anesthesia Checklist: Patient identified, Emergency Drugs available, Suction available, Patient being monitored and Timeout performed Patient Re-evaluated:Patient Re-evaluated prior to induction Oxygen Delivery Method: Circle system utilized Preoxygenation: Pre-oxygenation with 100% oxygen Induction Type: IV induction Ventilation: Mask ventilation without difficulty Laryngoscope Size: Mac and 4 Grade View: Grade I Tube type: Oral Tube size: 7.0 mm Number of attempts: 1 Airway Equipment and Method: Stylet Placement Confirmation: ETT inserted through vocal cords under direct vision,  positive ETCO2,  CO2 detector and breath sounds checked- equal and bilateral Secured at: 20 cm Tube secured with: Tape Dental Injury: Teeth and Oropharynx as per pre-operative assessment

## 2017-01-17 NOTE — Discharge Instructions (Signed)
Endoscopic Retrograde Cholangiopancreatogram, Care After This sheet gives you information about how to care for yourself after your procedure. Your health care provider may also give you more specific instructions. If you have problems or questions, contact your health care provider. What can I expect after the procedure? After the procedure, it is common to have:  Soreness in your throat.  Nausea.  Bloating.  Dizziness.  Tiredness (fatigue).  Follow these instructions at home:  Take over-the-counter and prescription medicines only as told by your health care provider.  Do not drive for 24 hours if you were given a medicine to help you relax (sedative) during your procedure. Have someone stay with you for 24 hours after the procedure.  Return to your normal activities as told by your health care provider. Ask your health care provider what activities are safe for you.  Return to eating what you normally do as soon as you feel well enough or as told by your health care provider.  Keep all follow-up visits as told by your health care provider. This is important. Contact a health care provider if:  You have pain in your abdomen that does not get better with medicine.  You develop signs of infection, such as: ? Chills. ? Feeling unwell. Get help right away if:  You have difficulty swallowing.  You have worsening pain in your throat, chest, or abdomen.  You vomit bright red blood or a substance that looks like coffee grounds.  You have bloody or very black stools.  You have a fever.  You have a sudden increase in swelling (bloating) in your abdomen. Summary  After the procedure, it is common to feel tired and to have some discomfort in your throat.  Contact your health care provider if you have signs of infection--such as chills or feeling unwell--or if you have pain that does not improve with medicine.  Get help right away if you have trouble swallowing, worsening  pain, bloody or black vomit, bloody or black stools, a fever, or increased swelling in your abdomen.  Keep all follow-up visits as told by your health care provider. This is important. This information is not intended to replace advice given to you by your health care provider. Make sure you discuss any questions you have with your health care provider. Document Released: 11/05/2012 Document Revised: 12/05/2015 Document Reviewed: 12/05/2015 Elsevier Interactive Patient Education  2017 Story, Care After These instructions provide you with information about caring for yourself after your procedure. Your health care provider may also give you more specific instructions. Your treatment has been planned according to current medical practices, but problems sometimes occur. Call your health care provider if you have any problems or questions after your procedure. What can I expect after the procedure? After your procedure, it is common to:  Feel sleepy for several hours.  Feel clumsy and have poor balance for several hours.  Feel forgetful about what happened after the procedure.  Have poor judgment for several hours.  Feel nauseous or vomit.  Have a sore throat if you had a breathing tube during the procedure.  Follow these instructions at home: For at least 24 hours after the procedure:   Do not: ? Participate in activities in which you could fall or become injured. ? Drive. ? Use heavy machinery. ? Drink alcohol. ? Take sleeping pills or medicines that cause drowsiness. ? Make important decisions or sign legal documents. ? Take care of children on your own.  Rest. Eating and drinking  Follow the diet that is recommended by your health care provider.  If you vomit, drink water, juice, or soup when you can drink without vomiting.  Make sure you have little or no nausea before eating solid foods. General instructions  Have a responsible adult  stay with you until you are awake and alert.  Take over-the-counter and prescription medicines only as told by your health care provider.  If you smoke, do not smoke without supervision.  Keep all follow-up visits as told by your health care provider. This is important. Contact a health care provider if:  You keep feeling nauseous or you keep vomiting.  You feel light-headed.  You develop a rash.  You have a fever. Get help right away if:  You have trouble breathing. This information is not intended to replace advice given to you by your health care provider. Make sure you discuss any questions you have with your health care provider. Document Released: 05/08/2015 Document Revised: 09/07/2015 Document Reviewed: 05/08/2015 Elsevier Interactive Patient Education  [employer].

## 2017-01-17 NOTE — Anesthesia Preprocedure Evaluation (Signed)
Anesthesia Evaluation  Patient identified by MRN, date of birth, ID band Patient awake    Reviewed: Allergy & Precautions, H&P , NPO status , Patient's Chart, lab work & pertinent test results  Airway Mallampati: II   Neck ROM: full    Dental   Pulmonary neg pulmonary ROS,    breath sounds clear to auscultation       Cardiovascular hypertension,  Rhythm:regular Rate:Normal     Neuro/Psych    GI/Hepatic   Endo/Other    Renal/GU      Musculoskeletal   Abdominal   Peds  Hematology   Anesthesia Other Findings   Reproductive/Obstetrics                             Anesthesia Physical Anesthesia Plan  ASA: II  Anesthesia Plan: General   Post-op Pain Management:    Induction: Intravenous  PONV Risk Score and Plan: 3 and Ondansetron, Dexamethasone and Treatment may vary due to age or medical condition  Airway Management Planned: Oral ETT  Additional Equipment:   Intra-op Plan:   Post-operative Plan: Extubation in OR  Informed Consent: I have reviewed the patients History and Physical, chart, labs and discussed the procedure including the risks, benefits and alternatives for the proposed anesthesia with the patient or authorized representative who has indicated his/her understanding and acceptance.     Plan Discussed with: CRNA, Anesthesiologist and Surgeon  Anesthesia Plan Comments:         Anesthesia Quick Evaluation

## 2017-01-17 NOTE — Interval H&P Note (Signed)
History and Physical Interval Note:  01/17/2017 12:51 PM  Deanna Wright  has presented today for surgery, with the diagnosis of dilated bile duct  The various methods of treatment have been discussed with the patient and family. After consideration of risks, benefits and other options for treatment, the patient has consented to  Procedure(s): ENDOSCOPIC RETROGRADE CHOLANGIOPANCREATOGRAPHY (ERCP) WITH PROPOFOL (N/A) as a surgical intervention .  The patient's history has been reviewed, patient examined, no change in status, stable for surgery.  I have reviewed the patient's chart and labs.  Questions were answered to the patient's satisfaction.     Francoise Chojnowski D

## 2017-01-17 NOTE — Op Note (Signed)
[facility] Patient Name: Deanna Wright Procedure Date: 01/17/2017 MRN: [id number] Attending MD: Carol Ada , MD Date of Birth: 03/03/1940 CSN: [id number] Age: 76 Admit Type: Inpatient Procedure:                ERCP Indications:              Abnormal endoscopic ultrasound of the biliary system Providers:                Carol Ada, MD, Elmer Ramp. Tilden Dome, RN, Elspeth Cho Tech., Technician, Christell Faith, CRNA Referring MD:              Medicines:                General Anesthesia, Indometacin 100 mg PR post                            procedure. Complications:            No immediate complications. Estimated Blood Loss:     Estimated blood loss was minimal. Procedure:                Pre-Anesthesia Assessment:                           - Prior to the procedure, a History and Physical                            was performed, and patient medications and                            allergies were reviewed. The patient's tolerance of                            previous anesthesia was also reviewed. The risks                            and benefits of the procedure and the sedation                            options and risks were discussed with the patient.                            All questions were answered, and informed consent                            was obtained. Prior Anticoagulants: The patient has                            taken no previous anticoagulant or antiplatelet                            agents. ASA Grade Assessment: II - A patient with  mild systemic disease. After reviewing the risks                            and benefits, the patient was deemed in                            satisfactory condition to undergo the procedure.                           - Sedation was administered by an anesthesia                            professional. General anesthesia was attained.   After obtaining informed consent, the scope was                            passed under direct vision. Throughout the                            procedure, the patient's blood pressure, pulse, and                            oxygen saturations were monitored continuously. The                            ERCP was introduced through the mouth, and used to                            inject contrast into and used to inject contrast                            into the bile duct. The Duodenoscope was introduced                            through the and used to inject contrast into. The                            ERCP was accomplished without difficulty. The                            patient tolerated the procedure well. Scope In: Scope Out: Findings:      The major papilla was normal. A short 0.035 inch Soft Jagwire was passed       into the biliary tree. A 10 mm biliary sphincterotomy was made with a       monofilament traction (standard) sphincterotome using ERBE       electrocautery. There was self limited oozing from the sphincterotomy       which did not require treatment. One 8.5 Fr by 5 cm biliary stent with a       single external flap and a single internal flap was placed 4 cm into the       common bile duct. Bile flowed through the stent. The stent was in good       position. This was biopsied with a cold forceps for histology.  Gross visualization of the papilla was obtained. Compared to the EUS,       there was a lack of spontaneous bile drainage from the ampulla.       Cannulation of the CBD was achieved during the second attempt. The       guidewire was secured just distal to the bifurcation. Multiple attempts       to advance the wire higher failed as the wire continued to loop back.       Contrast injection revealed a moderately dilated CBD and normal       intrahepatic duct caliber. In the distal CBD an oval, 8-10 mm, nonmobile       filling defect was identified. A 1 cm  sphincterotomy was created and       some oozing was noted. I opted not to extend the sphincterotomy as I was       not certain if the wire was going to precipitate bleeding from the       distal CBD lesion. Using a 12-15 mm balloon, I attempted to evert the       lesion for better visualization. I was not certain if I was successful       with this attempt. Several cold biopsies with the cold forceps were       obtained, but I do not know if it was the actual lesion versus the       mucosal lining of the ampulla. The CBD was swept several times, but       there was no evidence of any stones. An 8.5 Fr x 5 cm plastic biliary       stent was successfully placed in the CBD and copious bile drainage       occurred. Impression:               - The major papilla appeared normal.                           - A biliary sphincterotomy was performed.                           - One biliary stent was placed into the common bile                            duct. Moderate Sedation:      N/A- Per Anesthesia Care Recommendation:           - Patient has a contact number available for                            emergencies. The signs and symptoms of potential                            delayed complications were discussed with the                            patient. Return to normal activities tomorrow.                            Written discharge instructions were provided to the  patient. Procedure Code(s):        --- Professional ---                           [phone], Endoscopic retrograde                            cholangiopancreatography (ERCP); with placement of                            endoscopic stent into biliary or pancreatic duct,                            including pre- and post-dilation and guide wire                            passage, when performed, including sphincterotomy,                            when performed, each stent                           [id number],  Endoscopic retrograde                            cholangiopancreatography (ERCP); with biopsy,                            single or multiple Diagnosis Code(s):        --- Professional ---                           R93.2, Abnormal findings on diagnostic imaging of                            liver and biliary tract CPT copyright 2016 American [employer] Association. All rights reserved. The codes documented in this report are preliminary and upon coder review may  be revised to meet current compliance requirements. Carol Ada, MD Carol Ada, MD 01/17/2017 2:11:09 PM This report has been signed electronically. Number of Addenda: 0

## 2017-01-18 NOTE — Anesthesia Postprocedure Evaluation (Signed)
Anesthesia Post Note  Patient: Deanna Wright  Procedure(s) Performed: ENDOSCOPIC RETROGRADE CHOLANGIOPANCREATOGRAPHY (ERCP) WITH PROPOFOL (N/A )     Patient location during evaluation: PACU Anesthesia Type: General Level of consciousness: awake and alert Pain management: pain level controlled Vital Signs Assessment: post-procedure vital signs reviewed and stable Respiratory status: spontaneous breathing, nonlabored ventilation, respiratory function stable and patient connected to nasal cannula oxygen Cardiovascular status: blood pressure returned to baseline and stable Postop Assessment: no apparent nausea or vomiting Anesthetic complications: no    Last Vitals:  Vitals:   01/17/17 1420 01/17/17 1430  BP: (!) 154/70 (!) 156/90  Pulse: 66 65  Resp: 14 16  Temp:    SpO2: 100% 100%    Last Pain:  Vitals:   01/17/17 1410  TempSrc: Oral                 Ramla Hase S

## 2017-01-20 ENCOUNTER — Encounter (HOSPITAL_COMMUNITY): Payer: Self-pay | Admitting: Gastroenterology

## 2017-01-24 DIAGNOSIS — D481 Neoplasm of uncertain behavior of connective and other soft tissue: Secondary | ICD-10-CM | POA: Diagnosis not present

## 2017-01-24 DIAGNOSIS — M25561 Pain in right knee: Secondary | ICD-10-CM | POA: Diagnosis not present

## 2017-01-24 DIAGNOSIS — G8929 Other chronic pain: Secondary | ICD-10-CM | POA: Diagnosis not present

## 2017-01-24 DIAGNOSIS — M1711 Unilateral primary osteoarthritis, right knee: Secondary | ICD-10-CM | POA: Diagnosis not present

## 2017-03-29 DIAGNOSIS — D134 Benign neoplasm of liver: Secondary | ICD-10-CM

## 2017-03-29 HISTORY — DX: Benign neoplasm of liver: D13.4

## 2017-11-20 ENCOUNTER — Ambulatory Visit: Payer: Medicare HMO | Admitting: Neurology

## 2017-11-20 ENCOUNTER — Ambulatory Visit (INDEPENDENT_AMBULATORY_CARE_PROVIDER_SITE_OTHER): Payer: Medicare HMO | Admitting: Neurology

## 2017-11-20 ENCOUNTER — Encounter: Payer: Self-pay | Admitting: Neurology

## 2017-11-20 DIAGNOSIS — M79604 Pain in right leg: Secondary | ICD-10-CM

## 2017-11-20 NOTE — Procedures (Signed)
     HISTORY:  Deanna Wright is a 77 year old patient with a history of right leg pain in the lateral aspect of the right knee over the last 4 years.  The patient does have some low back pain and scoliosis, she is being evaluated for a possible neuropathy or a lumbar radiculopathy.  NERVE CONDUCTION STUDIES:  Nerve conduction studies were performed on the right lower extremity.  The distal motor latencies and motor amplitudes for the peroneal and posterior tibial nerves on the right were normal.  Slight swelling above the knee was seen for the right peroneal nerve, normal below the knee.  The nerve conduction velocity for the right posterior tibial nerve was normal.  The sensory latencies for the peroneal and sural nerves were normal and the F-wave latency for the right posterior tibial nerve was normal.  EMG STUDIES:  EMG study was performed on the right lower extremity:  The tibialis anterior muscle reveals 2 to 4K motor units with full recruitment. No fibrillations or positive waves were seen. The peroneus tertius muscle reveals 2 to 4K motor units with full recruitment. No fibrillations or positive waves were seen. The medial gastrocnemius muscle reveals 1 to 3K motor units with full recruitment. No fibrillations or positive waves were seen. The vastus lateralis muscle reveals 2 to 4K motor units with full recruitment. No fibrillations or positive waves were seen. The iliopsoas muscle reveals 2 to 4K motor units with full recruitment. No fibrillations or positive waves were seen. The biceps femoris muscle (long head) reveals 2 to 4K motor units with full recruitment. No fibrillations or positive waves were seen. The lumbosacral paraspinal muscles were tested at 3 levels, and revealed no abnormalities of insertional activity at all 3 levels tested. There was good relaxation.   IMPRESSION:  Nerve conduction studies were notable for some slowing of the peroneal nerve across the fibular  head, but EMG evaluation does not confirm the presence of a peroneal neuropathy.  There is no evidence of a lumbar radiculopathy on EMG evaluation.  Nerve conduction studies do not show evidence of a generalized peripheral neuropathy.  Jill Alexanders MD 11/20/2017 3:14 PM  [facility] [address] [place], [place] [address]  Phone [phone] Fax [phone]

## 2017-11-20 NOTE — Progress Notes (Signed)
Please refer to EMG and nerve conduction procedure note.  

## 2017-11-20 NOTE — Progress Notes (Signed)
[facility]    Nerve / Sites Muscle Latency Ref. Amplitude Ref. Rel Amp Segments Distance Velocity Ref. Area    ms ms mV mV %  cm m/s m/s mVms  R Peroneal - EDB     Ankle EDB 4.0 ?6.5 2.0 ?2.0 100 Ankle - EDB 9   7.4     Fib head EDB 9.4  1.9  97.8 Fib head - Ankle 26 48 ?44 6.7     Pop fossa EDB 12.0  1.2  62.9 Pop fossa - Fib head 10 38 ?44 4.2         Pop fossa - Ankle      R Tibial - AH     Ankle AH 3.2 ?5.8 5.2 ?4.0 100 Ankle - AH 9   12.9     Pop fossa AH 10.8  4.3  83.1 Pop fossa - Ankle 33 44 ?41 12.9         SNC    Nerve / Sites Rec. Site Peak Lat Ref.  Amp Ref. Segments Distance    ms ms V V  cm  R Sural - Ankle (Calf)     Calf Ankle 3.2 ?4.4 5 ?6 Calf - Ankle 14  R Superficial peroneal - Ankle     Lat leg Ankle 3.4 ?4.4 10 ?6 Lat leg - Ankle 14         F  Wave    Nerve F Lat Ref.   ms ms  R Tibial - AH 44.0 ?56.0       EMG full

## 2018-04-07 ENCOUNTER — Other Ambulatory Visit: Payer: Self-pay | Admitting: Pain Medicine

## 2018-04-07 DIAGNOSIS — M545 Low back pain, unspecified: Secondary | ICD-10-CM

## 2018-04-17 ENCOUNTER — Other Ambulatory Visit: Payer: Medicare HMO

## 2018-05-01 ENCOUNTER — Other Ambulatory Visit: Payer: Medicare HMO

## 2018-06-11 ENCOUNTER — Other Ambulatory Visit: Payer: Self-pay

## 2018-06-11 ENCOUNTER — Ambulatory Visit
Admission: RE | Admit: 2018-06-11 | Discharge: 2018-06-11 | Disposition: A | Discharge status: Home / Self Care | Payer: Medicare HMO | Source: Ambulatory Visit | Attending: Pain Medicine | Admitting: Pain Medicine

## 2018-06-11 ENCOUNTER — Other Ambulatory Visit: Payer: Self-pay | Admitting: Pain Medicine

## 2018-06-11 DIAGNOSIS — M545 Low back pain, unspecified: Secondary | ICD-10-CM

## 2018-06-11 DIAGNOSIS — M25551 Pain in right hip: Secondary | ICD-10-CM

## 2018-06-20 ENCOUNTER — Other Ambulatory Visit: Payer: Medicare HMO

## 2018-07-04 ENCOUNTER — Other Ambulatory Visit: Payer: Medicare HMO

## 2018-08-19 ENCOUNTER — Ambulatory Visit: Payer: Medicare HMO | Admitting: Orthopaedic Surgery

## 2018-09-17 ENCOUNTER — Ambulatory Visit: Payer: Medicare HMO | Admitting: Specialist

## 2018-09-26 ENCOUNTER — Telehealth: Payer: Self-pay | Admitting: Specialist

## 2018-09-26 NOTE — Telephone Encounter (Signed)
Patient would like to be placed on the cancellation list for earlier NEW patient appointment with Dr. Ninfa Linden.  CB#[phone].  Thank you.

## 2018-09-26 NOTE — Telephone Encounter (Signed)
I put her on the cancellation list 

## 2018-11-05 ENCOUNTER — Ambulatory Visit (INDEPENDENT_AMBULATORY_CARE_PROVIDER_SITE_OTHER): Payer: Medicare HMO | Admitting: Specialist

## 2018-11-05 VITALS — BP 135/75 | HR 65 | Ht 60.0 in | Wt 130.0 lb

## 2018-11-05 DIAGNOSIS — G8929 Other chronic pain: Secondary | ICD-10-CM

## 2018-11-05 DIAGNOSIS — M5440 Lumbago with sciatica, unspecified side: Secondary | ICD-10-CM

## 2018-11-19 ENCOUNTER — Telehealth: Payer: Self-pay | Admitting: Specialist

## 2018-11-19 NOTE — Telephone Encounter (Signed)
[facility]vised [facility] via fax patient left without being seen on 10/7 appt, as they requested copy of ov note

## 2019-02-18 DIAGNOSIS — M1611 Unilateral primary osteoarthritis, right hip: Secondary | ICD-10-CM | POA: Diagnosis present

## 2019-02-18 DIAGNOSIS — M87051 Idiopathic aseptic necrosis of right femur: Secondary | ICD-10-CM | POA: Diagnosis present

## 2019-02-18 DIAGNOSIS — K219 Gastro-esophageal reflux disease without esophagitis: Secondary | ICD-10-CM | POA: Diagnosis present

## 2019-02-18 DIAGNOSIS — I10 Essential (primary) hypertension: Secondary | ICD-10-CM | POA: Diagnosis present

## 2019-02-18 NOTE — H&P (Addendum)
HIP ARTHROPLASTY ADMISSION H&P  Patient ID: Deanna Wright MRN: [id number] DOB/AGE: [birth year] 79 y.o.  Chief Complaint: right hip pain.  Planned Procedure Date: 03/10/19 Medical Clearance by Dr. Zada Girt   HPI: PHILIP MOA is a 79 y.o. female with a history of severe degenerative scoliosis followed by Dr. Trenton Gammon, HTN, GERD who presents for evaluation of RIGHT HIP OA, AVN, FEMORAL COLLAPSE. The patient has a history of pain and functional disability in the right hip due to arthritis and has failed non-surgical conservative treatments for greater than 12 weeks to include NSAID's and/or analgesics, use of assistive devices and activity modification.  Onset of symptoms was gradual, starting 2 years ago with rapidlly worsening course since that time.  Patient currently rates pain at 9 out of 10 with activity. Patient has night pain, worsening of pain with activity and weight bearing and pain that interferes with activities of daily living.  Patient has evidence of subchondral cysts, subchondral sclerosis, periarticular osteophytes, joint subluxation, joint space narrowing and and femoral head fracture / collapse by imaging studies.  There is no active infection.  Past Medical History:  Diagnosis Date  . Hypertension    Past Surgical History:  Procedure Laterality Date  . APPENDECTOMY    . ENDOSCOPIC RETROGRADE CHOLANGIOPANCREATOGRAPHY (ERCP) WITH PROPOFOL N/A 01/17/2017   Procedure: ENDOSCOPIC RETROGRADE CHOLANGIOPANCREATOGRAPHY (ERCP) WITH PROPOFOL;  Surgeon: Carol Ada, MD;  Location: [facility];  Service: Endoscopy;  Laterality: N/A;  . EUS N/A 01/01/2017   Procedure: UPPER ENDOSCOPIC ULTRASOUND (EUS) LINEAR;  Surgeon: Carol Ada, MD;  Location: [facility];  Service: Endoscopy;  Laterality: N/A;  . KNEE SURGERY     Allergies  Allergen Reactions  . Other Other (See Comments)    MD advised to not take muscle relaxers  . Stadol [Butorphanol] Nausea And Vomiting and Other  (See Comments)    BP bottoms out    Medications: Amlodipine 10 mg-one half daily.   Losartan 100 mg daily.   Metoprolol 100 mg daily.   Omeprazole 20 mg daily.   Stool softener twice daily.   Hydrocodone 10/325 up to 4/day.  Social History: Widowed non smoker.  No alcohol use.  Family History: Mother with MI, HTN, CVA.  Father w/ HTN.  Brother w/ HTN, Cancer.  Sister w/ HTN, DM.   ROS: Currently denies lightheadedness, dizziness, Fever, chills, CP, SOB.  Constipation from pain medicines.   No personal history of DVT, PE, MI, or CVA. No loose teeth or dentures. All other systems have been reviewed and were otherwise currently negative with the exception of those mentioned in the HPI and as above.  Objective: Vitals: Ht: 5'0" Wt: 124 lbs Temp: 98.3 BP: 197/92 Pulse: 63 O2 100% on room air.   Physical Exam: General: Alert, NAD. In wheel chair. HEENT: EOMI, Good Neck Extension  Pulm: No increased work of breathing.  Clear B/L A/P w/o crackle or wheeze.  CV: RRR, 2/6 SEM. GI: soft, NT, ND Neuro: Neuro without gross focal deficit.  Sensation intact distally Skin: No lesions in the area of chief complaint MSK/Surgical Site: Right Hip pain with passive ROM.  Positive Stinchfield.  4/5 strength.  NVI.    Imaging Review Plain radiographs demonstrate severe degenerative joint disease of the right hip. Femoral head collapse / fracturing.  Preoperative templating of the joint replacement has been completed, documented, and submitted to the Operating Room personnel in order to optimize intra-operative equipment management.  Assessment: RIGHT HIP OA, AVN, FEMORAL COLLAPSE Principal Problem:  Avascular necrosis of hip, right (HCC) Active Problems:   Osteoarthritis of right hip   HTN (hypertension)   GERD (gastroesophageal reflux disease)   Plan: Plan for Procedure(s): TOTAL HIP ARTHROPLASTY ANTERIOR APPROACH  The patient history, physical exam, clinical judgement of the  provider and imaging are consistent with end stage degenerative joint disease and total joint arthroplasty is deemed medically necessary. The treatment options including medical management, injection therapy, and arthroplasty were discussed at length. The risks and benefits of Procedure(s): TOTAL HIP ARTHROPLASTY ANTERIOR APPROACH were presented and reviewed.  The risks of nonoperative treatment, versus surgical intervention including but not limited to continued pain, aseptic loosening, stiffness, dislocation/subluxation, infection, bleeding, nerve injury, blood clots, cardiopulmonary complications, morbidity, mortality, among others were discussed. The patient verbalizes understanding and wishes to proceed with the plan.  Patient is being admitted for surgery, pain control, PT, prophylactic antibiotics, VTE prophylaxis, progressive ambulation, ADL's and discharge planning.   Dental prophylaxis discussed and recommended for 2 years postoperatively.   The patient does meet the criteria for TXA which will be used perioperatively.    ASA 81 mg BID will be used postoperatively for DVT prophylaxis in addition to SCDs, and early ambulation.  Plan for oxycodone, Ibuprofen, 100 mg Gabapentin for pain.  Reported intolerance to spasm medicines.  Continue Omeprazole for gastric protection.  Patient hopes to avoid foley if possible.  If not, at least be sedated at time of placement.  The patient is planning to be discharged home with OPPT at [place] in care of her sister who is a Marine scientist.   Patient's anticipated LOS is less than 2 midnights, meeting these requirements: - Lives within 1 hour of care - Has a competent adult at home to recover with post-op recover - NO history of  - Diabetes  - Coronary Artery Disease  - Heart failure  - Heart attack  - Stroke  - DVT/VTE  - Cardiac arrhythmia  - Respiratory Failure/COPD  - Renal failure  - Anemia  - Advanced Liver disease   Prudencio Burly III, PA-C 02/18/2019 77 2:04 PM

## 2019-02-25 NOTE — Patient Instructions (Addendum)
DUE TO COVID-19 ONLY ONE VISITOR IS ALLOWED TO COME WITH YOU AND STAY IN THE WAITING ROOM ONLY DURING PRE OP AND PROCEDURE DAY OF SURGERY. THE 1 VISITOR MAY VISIT WITH YOU AFTER SURGERY IN YOUR PRIVATE ROOM DURING VISITING HOURS ONLY!  YOU NEED TO HAVE A COVID 19 TEST ON__2/5_____ @_2 :00 PM______, THIS TEST MUST BE DONE BEFORE SURGERY, COME  [address], [place] , [address].  ([place]) ONCE YOUR COVID TEST IS COMPLETED, PLEASE BEGIN THE QUARANTINE INSTRUCTIONS AS OUTLINED IN YOUR HANDOUT.                Deanna Wright    Your procedure is scheduled on: 03/10/19   Report to [facility] Main  Entrance    Report to Short Stay at 5:30 AM    Call this number if you have problems the morning of surgery [place], NO CHEWING GUM [place].   Do not eat food After Midnight.    YOU MAY HAVE CLEAR LIQUIDS FROM MIDNIGHT UNTIL 4:30AM   At 4:30AM Please finish the prescribed Pre-Surgery  drink.    Nothing by mouth after you finish the  drink !   Take these medicines the morning of surgery with A SIP OF WATER: metoprolol(ToprolXL), amlodipine(Norvasc),omeprazole(Prilosec)                       You may not have any metal on your body including hair pins and              piercings  Do not wear jewelry, make-up, lotions, powders or perfumes, deodorant             Do not wear nail polish on your fingernails.  Do not shave  48 hours prior to surgery.               Do not bring valuables to the hospital. [place].  Contacts, dentures or bridgework may not be worn into surgery.      Patients discharged the day of surgery will not be allowed to drive home.   IF YOU ARE HAVING SURGERY AND GOING HOME THE SAME DAY, YOU MUST HAVE AN ADULT TO DRIVE YOU HOME AND BE WITH YOU FOR 24 HOURS . YOU MAY GO HOME BY TAXI OR UBER OR ORTHERWISE, BUT AN  ADULT MUST ACCOMPANY YOU HOME AND STAY WITH YOU FOR 24 HOURS.  Name and phone number of your driver:  Special Instructions: N/A              Please read over the following fact sheets you were given: _____________________________________________________________________             [facility] - Preparing for Surgery  Before surgery, you can play an important role.   Because skin is not sterile, your skin needs to be as free of germs as possible.   You can reduce the number of germs on your skin by washing with CHG (chlorahexidine gluconate) soap before surgery .  CHG is an antiseptic cleaner which kills germs and bonds with the skin to continue killing germs even after washing. Please DO NOT use if you have an allergy to CHG or antibacterial soaps.   If your skin becomes reddened/irritated stop using the  CHG and inform your nurse when you arrive at Short Stay. Do not shave (including legs and underarms) for at least 48 hours prior to the first CHG shower.    Please follow these instructions carefully:  1.  Shower with CHG Soap the night before surgery and the  morning of Surgery.  2.  If you choose to wash your hair, wash your hair first as usual with your  normal  shampoo.  3.  After you shampoo, rinse your hair and body thoroughly to remove the  shampoo.                                        4.  Use CHG as you would any other liquid soap.  You can apply chg directly  to the skin and wash                       Gently with a scrungie or clean washcloth.  5.  Apply the CHG Soap to your body ONLY FROM THE NECK DOWN.   Do not use on face/ open                           Wound or open sores. Avoid contact with eyes, ears mouth and genitals (private parts).                       Wash face,  Genitals (private parts) with your normal soap.             6.  Wash thoroughly, paying special attention to the area where your surgery  will be performed.  7.  Thoroughly rinse your body with warm water  from the neck down.  8.  DO NOT shower/wash with your normal soap after using and rinsing off  the CHG Soap.             9.  Pat yourself dry with a clean towel.            10.  Wear clean pajamas.            11.  Place clean sheets on your bed the night of your first shower and do not  sleep with pets. Day of Surgery : Do not apply any lotions/deodorants the morning of surgery.  Please wear clean clothes to the hospital/surgery center.  FAILURE TO FOLLOW THESE INSTRUCTIONS MAY RESULT IN THE CANCELLATION OF YOUR SURGERY PATIENT SIGNATURE_________________________________  NURSE SIGNATURE__________________________________  ________________________________________________________________________   Deanna Wright  An incentive spirometer is a tool that can help keep your lungs clear and active. This tool measures how well you are filling your lungs with each breath. Taking long deep breaths may help reverse or decrease the chance of developing breathing (pulmonary) problems (especially infection) following:  A long period of time when you are unable to move or be active. BEFORE THE PROCEDURE   If the spirometer includes an indicator to show your best effort, your nurse or respiratory therapist will set it to a desired goal.  If possible, sit up straight or lean slightly forward. Try not to slouch.  Hold the incentive spirometer in an upright position. INSTRUCTIONS FOR USE  1. Sit on the edge of your bed if possible, or sit up as far as you can in bed or on a chair.  2. Hold the incentive spirometer in an upright position. 3. Breathe out normally. 4. Place the mouthpiece in your mouth and seal your lips tightly around it. 5. Breathe in slowly and as deeply as possible, raising the piston or the ball toward the top of the column. 6. Hold your breath for 3-5 seconds or for as long as possible. Allow the piston or ball to fall to the bottom of the column. 7. Remove the mouthpiece from  your mouth and breathe out normally. 8. Rest for a few seconds and repeat Steps 1 through 7 at least 10 times every 1-2 hours when you are awake. Take your time and take a few normal breaths between deep breaths. 9. The spirometer may include an indicator to show your best effort. Use the indicator as a goal to work toward during each repetition. 10. After each set of 10 deep breaths, practice coughing to be sure your lungs are clear. If you have an incision (the cut made at the time of surgery), support your incision when coughing by placing a pillow or rolled up towels firmly against it. Once you are able to get out of bed, walk around indoors and cough well. You may stop using the incentive spirometer when instructed by your caregiver.  RISKS AND COMPLICATIONS  Take your time so you do not get dizzy or light-headed.  If you are in pain, you may need to take or ask for pain medication before doing incentive spirometry. It is harder to take a deep breath if you are having pain. AFTER USE  Rest and breathe slowly and easily.  It can be helpful to keep track of a log of your progress. Your caregiver can provide you with a simple table to help with this. If you are using the spirometer at home, follow these instructions: [place] IF:   You are having difficultly using the spirometer.  You have trouble using the spirometer as often as instructed.  Your pain medication is not giving enough relief while using the spirometer.  You develop fever of 100.5 F (38.1 C) or higher. SEEK IMMEDIATE MEDICAL CARE IF:   You cough up bloody sputum that had not been present before.  You develop fever of 102 F (38.9 C) or greater.  You develop worsening pain at or near the incision site. MAKE SURE YOU:   Understand these instructions.  Will watch your condition.  Will get help right away if you are not doing well or get worse. Document Released: 05/28/2006 Document Revised: 04/09/2011  Document Reviewed: 07/29/2006 [facility] Patient Information 2014 [place], [place].   ________________________________________________________________________

## 2019-03-02 ENCOUNTER — Encounter (HOSPITAL_COMMUNITY)
Admission: RE | Admit: 2019-03-02 | Discharge: 2019-03-02 | Disposition: A | Discharge status: Home / Self Care | Payer: Medicare HMO | Source: Ambulatory Visit | Attending: Orthopedic Surgery | Admitting: Orthopedic Surgery

## 2019-03-02 ENCOUNTER — Other Ambulatory Visit: Payer: Self-pay

## 2019-03-02 ENCOUNTER — Encounter (HOSPITAL_COMMUNITY): Payer: Self-pay

## 2019-03-02 DIAGNOSIS — Z01818 Encounter for other preprocedural examination: Secondary | ICD-10-CM | POA: Diagnosis present

## 2019-03-02 HISTORY — DX: Cardiac murmur, unspecified: R01.1

## 2019-03-02 HISTORY — DX: Personal history of colonic polyps: Z86.010

## 2019-03-02 HISTORY — DX: Personal history of colon polyps, unspecified: Z86.0100

## 2019-03-02 HISTORY — DX: Gastric ulcer, unspecified as acute or chronic, without hemorrhage or perforation: K25.9

## 2019-03-02 HISTORY — DX: Gastro-esophageal reflux disease without esophagitis: K21.9

## 2019-03-02 HISTORY — DX: Unspecified osteoarthritis, unspecified site: M19.90

## 2019-03-02 HISTORY — DX: Other specified disorders of bone density and structure, unspecified site: M85.80

## 2019-03-02 HISTORY — DX: Unspecified asthma, uncomplicated: J45.909

## 2019-03-02 HISTORY — DX: Scoliosis, unspecified: M41.9

## 2019-03-02 LAB — URINALYSIS, ROUTINE W REFLEX MICROSCOPIC
Bacteria, UA: NONE SEEN
Bilirubin Urine: NEGATIVE
Glucose, UA: NEGATIVE mg/dL
Ketones, ur: NEGATIVE mg/dL
Nitrite: NEGATIVE
Protein, ur: NEGATIVE mg/dL
Specific Gravity, Urine: 1.014 (ref 1.005–1.030)
pH: 7 (ref 5.0–8.0)

## 2019-03-02 LAB — BASIC METABOLIC PANEL
Anion gap: 11 (ref 5–15)
BUN: 18 mg/dL (ref 8–23)
CO2: 26 mmol/L (ref 22–32)
Calcium: 9.3 mg/dL (ref 8.9–10.3)
Chloride: 103 mmol/L (ref 98–111)
Creatinine, Ser: 0.72 mg/dL (ref 0.44–1.00)
GFR calc Af Amer: >60 mL/min (ref >60)
GFR calc non Af Amer: >60 mL/min (ref >60)
Glucose, Bld: 102 mg/dL — ABNORMAL HIGH (ref 70–99)
Potassium: 3.7 mmol/L (ref 3.5–5.1)
Sodium: 140 mmol/L (ref 135–145)

## 2019-03-02 LAB — SURGICAL PCR SCREEN
MRSA, PCR: NEGATIVE
Staphylococcus aureus: POSITIVE — AB

## 2019-03-02 LAB — CBC
HCT: 38.6 % (ref 36.0–46.0)
Hemoglobin: 11.9 g/dL — ABNORMAL LOW (ref 12.0–15.0)
MCH: 25.9 pg — ABNORMAL LOW (ref 26.0–34.0)
MCHC: 30.8 g/dL (ref 30.0–36.0)
MCV: 83.9 fL (ref 80.0–100.0)
Platelets: 252 10*3/uL (ref 150–400)
RBC: 4.6 MIL/uL (ref 3.87–5.11)
RDW: 13.7 % (ref 11.5–15.5)
WBC: 7.4 10*3/uL (ref 4.0–10.5)
nRBC: 0 % (ref 0.0–0.2)

## 2019-03-02 NOTE — Progress Notes (Signed)
PCP - Dr. Grayland Ormond retired in 2020 last office visit 06/09/2018, clearance in chart Cardiologist - N/A  Chest x-ray - N/A EKG - 03/02/2019 Stress Test - N/A ECHO - N/A Cardiac Cath - N/A  Sleep Study - N/A CPAP - N/A  Fasting Blood Sugar - N/A Checks Blood Sugar _N/A____ times a day  Blood Thinner Instructions:  N/A Aspirin Instructions:  N/A Last Dose:  N/A  Anesthesia review:   N/A  Patient denies shortness of breath, fever, cough and chest pain at PAT appointment   Patient verbalized understanding of instructions that were given to them at the PAT appointment. Patient was also instructed that they will need to review over the PAT instructions again at home before surgery.

## 2019-03-04 NOTE — Care Plan (Signed)
Ortho Bundle Case Management Note  Patient Details  Name: Deanna Wright MRN: [id number] Date of Birth: [birth year]  Spoke with patient. She plans to discharge to home with sister to assist. She prefers to go straight to OPPT. Referral to [facility]. Rolling walker ordered from [place]. Patient and MD in agreement with plan. Choice offered.                  DME Arranged:  Gilford Rile rolling DME Agency:  [employer]  HH Arranged:    [place] Agency:     Additional Comments: Please contact me with any questions of if this plan should need to change.    03/04/2019, 12:03 PM

## 2019-03-06 ENCOUNTER — Other Ambulatory Visit (HOSPITAL_COMMUNITY)
Admission: RE | Admit: 2019-03-06 | Discharge: 2019-03-06 | Disposition: A | Discharge status: Home / Self Care | Payer: Medicare HMO | Source: Ambulatory Visit | Attending: Orthopedic Surgery | Admitting: Orthopedic Surgery

## 2019-03-06 ENCOUNTER — Other Ambulatory Visit (HOSPITAL_COMMUNITY): Payer: Medicare HMO

## 2019-03-06 DIAGNOSIS — Z20822 Contact with and (suspected) exposure to covid-19: Secondary | ICD-10-CM | POA: Insufficient documentation

## 2019-03-06 DIAGNOSIS — Z01812 Encounter for preprocedural laboratory examination: Secondary | ICD-10-CM | POA: Diagnosis present

## 2019-03-06 LAB — SARS CORONAVIRUS 2 (TAT 6-24 HRS): SARS Coronavirus 2: NEGATIVE

## 2019-03-09 MED ORDER — SILVER NITRATE-POT NITRATE 75-25 % EX MISC
CUTANEOUS | Status: AC
  Filled 2019-03-09: qty 10

## 2019-03-09 MED ORDER — BUPIVACAINE LIPOSOME 1.3 % IJ SUSP
10.0000 mL | Freq: Once | INTRAMUSCULAR | Status: DC
  Filled 2019-03-09: qty 10

## 2019-03-10 ENCOUNTER — Other Ambulatory Visit: Payer: Self-pay

## 2019-03-10 ENCOUNTER — Ambulatory Visit (HOSPITAL_COMMUNITY): Payer: Medicare HMO

## 2019-03-10 ENCOUNTER — Ambulatory Visit (HOSPITAL_COMMUNITY): Payer: Medicare HMO | Admitting: Physician Assistant

## 2019-03-10 ENCOUNTER — Ambulatory Visit (HOSPITAL_COMMUNITY)
Admission: RE | Admit: 2019-03-10 | Discharge: 2019-03-11 | Disposition: A | Discharge status: Home / Self Care | Payer: Medicare HMO | Source: Ambulatory Visit | Attending: Orthopedic Surgery | Admitting: Orthopedic Surgery

## 2019-03-10 ENCOUNTER — Encounter (HOSPITAL_COMMUNITY)
Admission: RE | Disposition: A | Discharge status: Home / Self Care | Payer: Self-pay | Source: Ambulatory Visit | Attending: Orthopedic Surgery

## 2019-03-10 ENCOUNTER — Encounter (HOSPITAL_COMMUNITY): Payer: Self-pay | Admitting: Orthopedic Surgery

## 2019-03-10 ENCOUNTER — Ambulatory Visit (HOSPITAL_COMMUNITY): Payer: Medicare HMO | Admitting: Certified Registered Nurse Anesthetist

## 2019-03-10 DIAGNOSIS — I10 Essential (primary) hypertension: Secondary | ICD-10-CM | POA: Diagnosis present

## 2019-03-10 DIAGNOSIS — K219 Gastro-esophageal reflux disease without esophagitis: Secondary | ICD-10-CM | POA: Diagnosis present

## 2019-03-10 DIAGNOSIS — Z419 Encounter for procedure for purposes other than remedying health state, unspecified: Secondary | ICD-10-CM

## 2019-03-10 DIAGNOSIS — M879 Osteonecrosis, unspecified: Secondary | ICD-10-CM | POA: Diagnosis not present

## 2019-03-10 DIAGNOSIS — Z79891 Long term (current) use of opiate analgesic: Secondary | ICD-10-CM | POA: Diagnosis not present

## 2019-03-10 DIAGNOSIS — M1610 Unilateral primary osteoarthritis, unspecified hip: Secondary | ICD-10-CM | POA: Diagnosis present

## 2019-03-10 DIAGNOSIS — Z79899 Other long term (current) drug therapy: Secondary | ICD-10-CM | POA: Diagnosis not present

## 2019-03-10 DIAGNOSIS — M25551 Pain in right hip: Secondary | ICD-10-CM | POA: Diagnosis present

## 2019-03-10 DIAGNOSIS — M87051 Idiopathic aseptic necrosis of right femur: Secondary | ICD-10-CM | POA: Diagnosis present

## 2019-03-10 DIAGNOSIS — M1611 Unilateral primary osteoarthritis, right hip: Secondary | ICD-10-CM | POA: Diagnosis not present

## 2019-03-10 HISTORY — PX: TOTAL HIP ARTHROPLASTY: SHX124

## 2019-03-10 SURGERY — ARTHROPLASTY, HIP, TOTAL, ANTERIOR APPROACH
Anesthesia: Spinal | Site: Hip | Laterality: Right

## 2019-03-10 MED ORDER — PHENOL 1.4 % MT LIQD
1.0000 | OROMUCOSAL | Status: DC | PRN
  Filled 2019-03-10: qty 177

## 2019-03-10 MED ORDER — BUPIVACAINE IN DEXTROSE 0.75-8.25 % IT SOLN
INTRATHECAL | Status: DC | PRN
  Administered 2019-03-10: 1.6 mL via INTRATHECAL

## 2019-03-10 MED ORDER — EPHEDRINE SULFATE 50 MG/ML IJ SOLN
INTRAMUSCULAR | Status: DC | PRN
  Administered 2019-03-10: 10 mg via INTRAVENOUS
  Administered 2019-03-10: 5 mg via INTRAVENOUS
  Administered 2019-03-10 (×2): 10 mg via INTRAVENOUS

## 2019-03-10 MED ORDER — PHENYLEPHRINE HCL (PRESSORS) 10 MG/ML IV SOLN
INTRAVENOUS | Status: DC | PRN
  Administered 2019-03-10: 80 ug via INTRAVENOUS
  Administered 2019-03-10: 120 ug via INTRAVENOUS

## 2019-03-10 MED ORDER — SODIUM CHLORIDE (PF) 0.9 % IJ SOLN
INTRAMUSCULAR | Status: AC
  Filled 2019-03-10: qty 10

## 2019-03-10 MED ORDER — METOCLOPRAMIDE HCL 5 MG/ML IJ SOLN: 5.0000 mg | Freq: Three times a day (TID) | INTRAMUSCULAR | Status: DC | PRN

## 2019-03-10 MED ORDER — BUPIVACAINE LIPOSOME 1.3 % IJ SUSP
INTRAMUSCULAR | Status: DC | PRN
  Administered 2019-03-10: 10 mL

## 2019-03-10 MED ORDER — FENTANYL CITRATE (PF) 100 MCG/2ML IJ SOLN
INTRAMUSCULAR | Status: AC
  Filled 2019-03-10: qty 2

## 2019-03-10 MED ORDER — DEXAMETHASONE SODIUM PHOSPHATE 4 MG/ML IJ SOLN
INTRAMUSCULAR | Status: DC | PRN
  Administered 2019-03-10: 10 mg via INTRAVENOUS

## 2019-03-10 MED ORDER — ONDANSETRON HCL 4 MG/2ML IJ SOLN
INTRAMUSCULAR | Status: DC | PRN
  Administered 2019-03-10: 4 mg via INTRAVENOUS

## 2019-03-10 MED ORDER — ACETAMINOPHEN 500 MG PO TABS
1000.0000 mg | ORAL_TABLET | Freq: Four times a day (QID) | ORAL | Status: DC
  Administered 2019-03-10 – 2019-03-11 (×3): 1000 mg via ORAL
  Filled 2019-03-10 (×3): qty 2

## 2019-03-10 MED ORDER — LACTATED RINGERS IV SOLN: INTRAVENOUS | Status: DC

## 2019-03-10 MED ORDER — MENTHOL 3 MG MT LOZG: 1.0000 | LOZENGE | OROMUCOSAL | Status: DC | PRN

## 2019-03-10 MED ORDER — PANTOPRAZOLE SODIUM 40 MG PO TBEC
40.0000 mg | DELAYED_RELEASE_TABLET | Freq: Every day | ORAL | Status: DC
  Administered 2019-03-10 – 2019-03-11 (×2): 40 mg via ORAL
  Filled 2019-03-10 (×2): qty 1

## 2019-03-10 MED ORDER — DIPHENHYDRAMINE HCL 12.5 MG/5ML PO ELIX: 12.5000 mg | ORAL_SOLUTION | ORAL | Status: DC | PRN

## 2019-03-10 MED ORDER — CEFAZOLIN SODIUM-DEXTROSE 2-4 GM/100ML-% IV SOLN
2.0000 g | INTRAVENOUS | Status: AC
  Administered 2019-03-10: 2 g via INTRAVENOUS

## 2019-03-10 MED ORDER — OXYCODONE HCL 5 MG PO TABS
ORAL_TABLET | ORAL | Status: AC
  Administered 2019-03-10: 5 mg via ORAL
  Filled 2019-03-10: qty 1

## 2019-03-10 MED ORDER — ONDANSETRON HCL 4 MG/2ML IJ SOLN: 4.0000 mg | Freq: Four times a day (QID) | INTRAMUSCULAR | Status: DC | PRN

## 2019-03-10 MED ORDER — METOCLOPRAMIDE HCL 5 MG PO TABS: 5.0000 mg | ORAL_TABLET | Freq: Three times a day (TID) | ORAL | Status: DC | PRN

## 2019-03-10 MED ORDER — LACTATED RINGERS IV BOLUS
250.0000 mL | Freq: Once | INTRAVENOUS | Status: AC
  Administered 2019-03-10: 250 mL via INTRAVENOUS

## 2019-03-10 MED ORDER — POVIDONE-IODINE 10 % EX SWAB: 2.0000 "application " | Freq: Once | CUTANEOUS | Status: DC

## 2019-03-10 MED ORDER — METOPROLOL SUCCINATE ER 50 MG PO TB24
100.0000 mg | ORAL_TABLET | Freq: Every day | ORAL | Status: DC
  Administered 2019-03-11: 10:00:00 100 mg via ORAL
  Filled 2019-03-10: qty 2

## 2019-03-10 MED ORDER — DOCUSATE SODIUM 100 MG PO CAPS
100.0000 mg | ORAL_CAPSULE | Freq: Two times a day (BID) | ORAL | Status: DC
  Administered 2019-03-10 – 2019-03-11 (×2): 100 mg via ORAL
  Filled 2019-03-10 (×2): qty 1

## 2019-03-10 MED ORDER — 0.9 % SODIUM CHLORIDE (POUR BTL) OPTIME
TOPICAL | Status: DC | PRN
  Administered 2019-03-10: 1000 mL

## 2019-03-10 MED ORDER — ASPIRIN 81 MG PO CHEW
81.0000 mg | CHEWABLE_TABLET | Freq: Two times a day (BID) | ORAL | Status: DC
  Administered 2019-03-10 – 2019-03-11 (×2): 81 mg via ORAL
  Filled 2019-03-10 (×2): qty 1

## 2019-03-10 MED ORDER — ACETAMINOPHEN 500 MG PO TABS: 1000.0000 mg | ORAL_TABLET | Freq: Once | ORAL | Status: AC

## 2019-03-10 MED ORDER — CEFAZOLIN SODIUM-DEXTROSE 2-4 GM/100ML-% IV SOLN
INTRAVENOUS | Status: AC
  Filled 2019-03-10: qty 100

## 2019-03-10 MED ORDER — POLYETHYLENE GLYCOL 3350 17 G PO PACK: 17.0000 g | PACK | Freq: Every day | ORAL | Status: DC | PRN

## 2019-03-10 MED ORDER — PROMETHAZINE HCL 25 MG/ML IJ SOLN: 6.2500 mg | INTRAMUSCULAR | Status: DC | PRN

## 2019-03-10 MED ORDER — TRANEXAMIC ACID-NACL 1000-0.7 MG/100ML-% IV SOLN
1000.0000 mg | INTRAVENOUS | Status: AC
  Administered 2019-03-10: 1000 mg via INTRAVENOUS

## 2019-03-10 MED ORDER — PROPOFOL 10 MG/ML IV BOLUS
INTRAVENOUS | Status: AC
  Filled 2019-03-10: qty 20

## 2019-03-10 MED ORDER — OXYCODONE HCL 5 MG PO TABS
5.0000 mg | ORAL_TABLET | ORAL | Status: DC | PRN
  Administered 2019-03-10 – 2019-03-11 (×3): 5 mg via ORAL
  Filled 2019-03-10 (×3): qty 1

## 2019-03-10 MED ORDER — GABAPENTIN 100 MG PO CAPS: 100.0000 mg | ORAL_CAPSULE | Freq: Two times a day (BID) | ORAL | 0 refills | Status: DC | PRN

## 2019-03-10 MED ORDER — TRANEXAMIC ACID-NACL 1000-0.7 MG/100ML-% IV SOLN
INTRAVENOUS | Status: AC
  Filled 2019-03-10: qty 100

## 2019-03-10 MED ORDER — HYDROMORPHONE HCL 1 MG/ML IJ SOLN: 0.2500 mg | INTRAMUSCULAR | Status: DC | PRN

## 2019-03-10 MED ORDER — IBUPROFEN 600 MG PO TABS: 600.0000 mg | ORAL_TABLET | Freq: Three times a day (TID) | ORAL | 0 refills | Status: DC | PRN

## 2019-03-10 MED ORDER — CHLORHEXIDINE GLUCONATE 4 % EX LIQD: 60.0000 mL | Freq: Once | CUTANEOUS | Status: DC

## 2019-03-10 MED ORDER — OXYCODONE HCL 5 MG PO TABS: 5.0000 mg | ORAL_TABLET | Freq: Four times a day (QID) | ORAL | 0 refills | Status: AC | PRN

## 2019-03-10 MED ORDER — LOSARTAN POTASSIUM 50 MG PO TABS
100.0000 mg | ORAL_TABLET | Freq: Every day | ORAL | Status: DC
  Administered 2019-03-11: 100 mg via ORAL
  Filled 2019-03-10: qty 2

## 2019-03-10 MED ORDER — FENTANYL CITRATE (PF) 100 MCG/2ML IJ SOLN
INTRAMUSCULAR | Status: DC | PRN
  Administered 2019-03-10: 100 ug via INTRAVENOUS

## 2019-03-10 MED ORDER — ONDANSETRON HCL 4 MG PO TABS: 4.0000 mg | ORAL_TABLET | Freq: Three times a day (TID) | ORAL | 0 refills | Status: DC | PRN

## 2019-03-10 MED ORDER — MAGNESIUM CITRATE PO SOLN: 1.0000 | Freq: Once | ORAL | Status: DC | PRN

## 2019-03-10 MED ORDER — POVIDONE-IODINE 10 % EX SWAB
2.0000 "application " | Freq: Once | CUTANEOUS | Status: AC
  Administered 2019-03-10: 2 via TOPICAL

## 2019-03-10 MED ORDER — WATER FOR IRRIGATION, STERILE IR SOLN
Status: DC | PRN
  Administered 2019-03-10: 1000 mL

## 2019-03-10 MED ORDER — PHENYLEPHRINE 40 MCG/ML (10ML) SYRINGE FOR IV PUSH (FOR BLOOD PRESSURE SUPPORT)
PREFILLED_SYRINGE | INTRAVENOUS | Status: AC
  Filled 2019-03-10: qty 10

## 2019-03-10 MED ORDER — ACETAMINOPHEN 325 MG PO TABS: 325.0000 mg | ORAL_TABLET | Freq: Four times a day (QID) | ORAL | Status: DC | PRN

## 2019-03-10 MED ORDER — ACETAMINOPHEN 500 MG PO TABS
ORAL_TABLET | ORAL | Status: AC
  Administered 2019-03-10: 1000 mg via ORAL
  Filled 2019-03-10: qty 2

## 2019-03-10 MED ORDER — DEXAMETHASONE SODIUM PHOSPHATE 10 MG/ML IJ SOLN
10.0000 mg | Freq: Once | INTRAMUSCULAR | Status: AC
  Administered 2019-03-11: 05:00:00 10 mg via INTRAVENOUS
  Filled 2019-03-10: qty 1

## 2019-03-10 MED ORDER — SODIUM CHLORIDE FLUSH 0.9 % IV SOLN
INTRAVENOUS | Status: DC | PRN
  Administered 2019-03-10: 10 mL

## 2019-03-10 MED ORDER — GABAPENTIN 100 MG PO CAPS
100.0000 mg | ORAL_CAPSULE | Freq: Two times a day (BID) | ORAL | Status: DC
  Filled 2019-03-10 (×2): qty 1

## 2019-03-10 MED ORDER — ONDANSETRON HCL 4 MG PO TABS
4.0000 mg | ORAL_TABLET | Freq: Four times a day (QID) | ORAL | Status: DC | PRN
  Filled 2019-03-10: qty 1

## 2019-03-10 MED ORDER — ASPIRIN EC 81 MG PO TBEC: 81.0000 mg | DELAYED_RELEASE_TABLET | Freq: Two times a day (BID) | ORAL | 0 refills | Status: DC

## 2019-03-10 MED ORDER — PROPOFOL 500 MG/50ML IV EMUL
INTRAVENOUS | Status: DC | PRN
  Administered 2019-03-10: 75 ug/kg/min via INTRAVENOUS

## 2019-03-10 MED ORDER — AMLODIPINE BESYLATE 10 MG PO TABS
10.0000 mg | ORAL_TABLET | Freq: Every day | ORAL | Status: DC
  Administered 2019-03-11: 10 mg via ORAL
  Filled 2019-03-10: qty 1

## 2019-03-10 MED ORDER — SORBITOL 70 % SOLN
30.0000 mL | Freq: Every day | Status: DC | PRN
  Filled 2019-03-10: qty 30

## 2019-03-10 MED ORDER — CEFAZOLIN SODIUM-DEXTROSE 1-4 GM/50ML-% IV SOLN
1.0000 g | Freq: Four times a day (QID) | INTRAVENOUS | Status: AC
  Administered 2019-03-10 (×2): 1 g via INTRAVENOUS
  Filled 2019-03-10 (×2): qty 50

## 2019-03-10 SURGICAL SUPPLY — 42 items
BAG ZIPLOCK 12X15 (MISCELLANEOUS) IMPLANT
BLADE SAG 18X100X1.27 (BLADE) ×2 IMPLANT
BLADE SURG SZ10 CARB STEEL (BLADE) ×4 IMPLANT
CHLORAPREP W/TINT 26 (MISCELLANEOUS) ×2 IMPLANT
CLSR STERI-STRIP ANTIMIC 1/2X4 (GAUZE/BANDAGES/DRESSINGS) ×2 IMPLANT
COVER PERINEAL POST (MISCELLANEOUS) ×2 IMPLANT
COVER SURGICAL LIGHT HANDLE (MISCELLANEOUS) ×2 IMPLANT
COVER WAND RF STERILE (DRAPES) IMPLANT
DECANTER SPIKE VIAL GLASS SM (MISCELLANEOUS) ×4 IMPLANT
DRAPE IMP U-DRAPE 54X76 (DRAPES) ×2 IMPLANT
DRAPE STERI IOBAN 125X83 (DRAPES) ×2 IMPLANT
DRAPE U-SHAPE 47X51 STRL (DRAPES) ×4 IMPLANT
DRSG MEPILEX BORDER 4X8 (GAUZE/BANDAGES/DRESSINGS) ×2 IMPLANT
ELECT BLADE TIP CTD 4 INCH (ELECTRODE) ×2 IMPLANT
ELECT REM PT RETURN 15FT ADLT (MISCELLANEOUS) ×2 IMPLANT
GLOVE BIO SURGEON STRL SZ7.5 (GLOVE) ×4 IMPLANT
GLOVE BIOGEL PI IND STRL 8 (GLOVE) ×2 IMPLANT
GLOVE BIOGEL PI INDICATOR 8 (GLOVE) ×2
GOWN STRL REUS W/TWL LRG LVL3 (GOWN DISPOSABLE) ×2 IMPLANT
GOWN STRL REUS W/TWL XL LVL3 (GOWN DISPOSABLE) ×2 IMPLANT
HEAD CERAMIC FEMORAL 36MM (Head) ×2 IMPLANT
HOLDER FOLEY CATH W/STRAP (MISCELLANEOUS) IMPLANT
INSERT POLYETHYLENE 36M-0 (Insert) ×2 IMPLANT
KIT TURNOVER KIT A (KITS) IMPLANT
MANIFOLD NEPTUNE II (INSTRUMENTS) ×2 IMPLANT
NS IRRIG 1000ML POUR BTL (IV SOLUTION) ×2 IMPLANT
PACK ANTERIOR HIP CUSTOM (KITS) ×2 IMPLANT
PENCIL SMOKE EVACUATOR (MISCELLANEOUS) IMPLANT
PROTECTOR NERVE ULNAR (MISCELLANEOUS) ×2 IMPLANT
SCREW HEX LP 6.5X20 (Screw) ×2 IMPLANT
SHELL ACETAB TRIDENT 48 (Shell) ×2 IMPLANT
STEM HIP 127 DEG (Stem) ×2 IMPLANT
SUT MNCRL AB 4-0 PS2 18 (SUTURE) ×2 IMPLANT
SUT STRATAFIX 0 PDS 27 VIOLET (SUTURE) ×2
SUT VIC AB 0 CT1 36 (SUTURE) ×2 IMPLANT
SUT VIC AB 1 CT1 36 (SUTURE) ×2 IMPLANT
SUT VIC AB 2-0 CT1 27 (SUTURE) ×2
SUT VIC AB 2-0 CT1 TAPERPNT 27 (SUTURE) ×2 IMPLANT
SUTURE STRATFX 0 PDS 27 VIOLET (SUTURE) ×1 IMPLANT
TRAY FOLEY MTR SLVR 16FR STAT (SET/KITS/TRAYS/PACK) IMPLANT
WATER STERILE IRR 1000ML POUR (IV SOLUTION) ×4 IMPLANT
YANKAUER SUCT BULB TIP 10FT TU (MISCELLANEOUS) ×2 IMPLANT

## 2019-03-10 NOTE — Anesthesia Preprocedure Evaluation (Signed)
Anesthesia Evaluation  Patient identified by MRN, date of birth, ID band Patient awake    Reviewed: Allergy & Precautions, NPO status , Patient's Chart, lab work & pertinent test results  Airway Mallampati: II  TM Distance: >3 FB Neck ROM: Full    Dental no notable dental hx.    Pulmonary neg pulmonary ROS,    Pulmonary exam normal breath sounds clear to auscultation       Cardiovascular hypertension, Normal cardiovascular exam Rhythm:Regular Rate:Normal     Neuro/Psych negative neurological ROS  negative psych ROS   GI/Hepatic Neg liver ROS, GERD  ,  Endo/Other  negative endocrine ROS  Renal/GU negative Renal ROS  negative genitourinary   Musculoskeletal negative musculoskeletal ROS (+)   Abdominal   Peds negative pediatric ROS (+)  Hematology negative hematology ROS (+)   Anesthesia Other Findings   Reproductive/Obstetrics negative OB ROS                             Anesthesia Physical Anesthesia Plan  ASA: II  Anesthesia Plan: Spinal   Post-op Pain Management:    Induction: Intravenous  PONV Risk Score and Plan: 2 and Ondansetron, Dexamethasone and Treatment may vary due to age or medical condition  Airway Management Planned: Simple Face Mask  Additional Equipment:   Intra-op Plan:   Post-operative Plan:   Informed Consent: I have reviewed the patients History and Physical, chart, labs and discussed the procedure including the risks, benefits and alternatives for the proposed anesthesia with the patient or authorized representative who has indicated his/her understanding and acceptance.     Dental advisory given  Plan Discussed with: CRNA and Surgeon  Anesthesia Plan Comments:         Anesthesia Quick Evaluation

## 2019-03-10 NOTE — Anesthesia Postprocedure Evaluation (Signed)
Anesthesia Post Note  Patient: Deanna Wright  Procedure(s) Performed: TOTAL HIP ARTHROPLASTY ANTERIOR APPROACH (Right Hip)     Patient location during evaluation: PACU Anesthesia Type: Spinal Level of consciousness: oriented and awake and alert Pain management: pain level controlled Vital Signs Assessment: post-procedure vital signs reviewed and stable Respiratory status: spontaneous breathing, respiratory function stable and patient connected to nasal cannula oxygen Cardiovascular status: blood pressure returned to baseline and stable Postop Assessment: no headache, no backache and no apparent nausea or vomiting Anesthetic complications: no    Last Vitals:  Vitals:   03/10/19 1000 03/10/19 1015  BP: 117/64 124/63  Pulse: 60 60  Resp: 15 15  Temp:  36.6 C  SpO2: 98% 100%    Last Pain:  Vitals:   03/10/19 1015  TempSrc:   PainSc: 0-No pain                 Micajah Dennin S

## 2019-03-10 NOTE — Transfer of Care (Signed)
Immediate Anesthesia Transfer of Care Note  Patient: Deanna Wright  Procedure(s) Performed: TOTAL HIP ARTHROPLASTY ANTERIOR APPROACH (Right Hip)  Patient Location: PACU  Anesthesia Type:Spinal  Level of Consciousness: sedated, patient cooperative and responds to stimulation  Airway & Oxygen Therapy: Patient Spontanous Breathing and Patient connected to face mask oxygen  Post-op Assessment: Report given to RN and Post -op Vital signs reviewed and stable  Post vital signs: Reviewed and stable  Last Vitals:  Vitals Value Taken Time  BP 90/45 03/10/19 0922  Temp    Pulse 61 03/10/19 0924  Resp 16 03/10/19 0924  SpO2 100 % 03/10/19 0924  Vitals shown include unvalidated device data.  Last Pain:  Vitals:   03/10/19 0615  TempSrc:   PainSc: 0-No pain         Complications: No apparent anesthesia complications

## 2019-03-10 NOTE — Plan of Care (Signed)

## 2019-03-10 NOTE — Interval H&P Note (Signed)
I participated in the care of this patient and agree with the above history, physical and evaluation. I performed a review of the history and a physical exam as detailed   Natesha Hassey Daniel Ezequiel Macauley MD  

## 2019-03-10 NOTE — Discharge Instructions (Signed)

## 2019-03-10 NOTE — Op Note (Signed)
03/10/2019  8:53 AM  PATIENT:  Deanna Wright   MRN: [id number]  PRE-OPERATIVE DIAGNOSIS:  RIGHT HIP OA, AVN, FEMORAL COLLAPSE  POST-OPERATIVE DIAGNOSIS:  RIGHT HIP OA, AVN, FEMORAL COLLAPSE  PROCEDURE:  Procedure(s): TOTAL HIP ARTHROPLASTY ANTERIOR APPROACH  PREOPERATIVE INDICATIONS:    Deanna Wright is an 79 y.o. female who has a diagnosis of Avascular necrosis of hip, right ([place]) and elected for surgical management after failing conservative treatment.  The risks benefits and alternatives were discussed with the patient including but not limited to the risks of nonoperative treatment, versus surgical intervention including infection, bleeding, nerve injury, periprosthetic fracture, the need for revision surgery, dislocation, leg length discrepancy, blood clots, cardiopulmonary complications, morbidity, mortality, among others, and they were willing to proceed.     OPERATIVE REPORT     SURGEON:   Renette Butters, MD    ASSISTANT:  Roxan Hockey, PA-C, he was present and scrubbed throughout the case, critical for completion in a timely fashion, and for retraction, instrumentation, and closure.     ANESTHESIA:  General    COMPLICATIONS:  None.     COMPONENTS:  Stryker acolade fit femur size 5 with a 36 mm -0 head ball and an acetabular shell size 48 with a  polyethylene liner    PROCEDURE IN DETAIL:   The patient was met in the holding area and  identified.  The appropriate hip was identified and marked at the operative site.  The patient was then transported to the OR  and  placed under anesthesia per that record.  At that point, the patient was  placed in the supine position and  secured to the operating room table and all bony prominences padded. He received pre-operative antibiotics    The operative lower extremity was prepped from the iliac crest to the distal leg.  Sterile draping was performed.  Time out was performed prior to incision.      Skin incision was  made just 2 cm lateral to the ASIS  extending in line with the tensor fascia lata. Electrocautery was used to control all bleeders. I dissected down sharply to the fascia of the tensor fascia lata was confirmed that the muscle fibers beneath were running posteriorly. I then incised the fascia over the superficial tensor fascia lata in line with the incision. The fascia was elevated off the anterior aspect of the muscle the muscle was retracted posteriorly and protected throughout the case. I then used electrocautery to incise the tensor fascia lata fascia control and all bleeders. Immediately visible was the fat over top of the anterior neck and capsule.  I removed the anterior fat from the capsule and elevated the rectus muscle off of the anterior capsule. I then removed a large time of capsule. The retractors were then placed over the anterior acetabulum as well as around the superior and inferior neck.  I then made a femoral neck cut. Then used the power corkscrew to remove the femoral head from the acetabulum and thoroughly irrigated the acetabulum. I sized the femoral head.    I then exposed the deep acetabulum, cleared out any tissue including the ligamentum teres.   After adequate visualization, I excised the labrum, and then sequentially reamed.  I then impacted the acetabular implant into place using fluoroscopy for guidance.  Appropriate version and inclination was confirmed clinically matching their bony anatomy, and with fluoroscopy.  I placed a 20 mm screw in the posterior/superio position with an excellent bite.  I then placed the polyethylene liner in place  I then adducted the leg and released the external rotators from the posterior femur allowing it to be easily delivered up lateral and anterior to the acetabulum for preparation of the femoral canal.    I then prepared the proximal femur using the cookie-cutter and then sequentially reamed and broached.  A trial broach, neck, and  head was utilized, and I reduced the hip and used floroscopy to assess the neck length and femoral implant.  I then impacted the femoral prosthesis into place into the appropriate version. The hip was then reduced and fluoroscopy confirmed appropriate position. Leg lengths were restored.  I then irrigated the hip copiously again with, and repaired the fascia with Vicryl, followed by monocryl for the subcutaneous tissue, Monocryl for the skin, Steri-Strips and sterile gauze. The patient was then awakened and returned to PACU in stable and satisfactory condition. There were no complications.  POST OPERATIVE PLAN: WBAT, DVT px: SCD's/TED, ambulation and chemical dvt px  Calvyn Kurtzman, MD Orthopedic Surgeon [phone]     

## 2019-03-10 NOTE — Evaluation (Signed)
Physical Therapy Evaluation Patient Details Name: Deanna Wright MRN: [id number] DOB: 1940-05-10 Today's Date: 03/10/2019   History of Present Illness  79 yo female s/p R THA 03/10/19. Hx of AVN  Clinical Impression  On eval POD 0, pt required Min assist for mobility. She walked ~115 feet with a RW. Minimal pain with activity. Pt participated well. Will continue to follow and progress activity as tolerated.     Follow Up Recommendations Follow surgeon's recommendation for DC plan and follow-up therapies    Equipment Recommendations  None recommended by PT    Recommendations for Other Services       Precautions / Restrictions Precautions Precautions: Fall Restrictions Weight Bearing Restrictions: No Other Position/Activity Restrictions: WBAT      Mobility  Bed Mobility Overal bed mobility: Needs Assistance Bed Mobility: Supine to Sit     Supine to sit: Min guard;HOB elevated     General bed mobility comments: Increased time. Cues for safety. Pt relied on bedrail.  Transfers Overall transfer level: Needs assistance Equipment used: Rolling walker (2 wheeled) Transfers: Sit to/from Stand Sit to Stand: Min assist         General transfer comment: VCs safety, technique, hand placement. Assist to stabilize  Ambulation/Gait Ambulation/Gait assistance: Min assist Gait Distance (Feet): 115 Feet Assistive device: Rolling walker (2 wheeled) Gait Pattern/deviations: Step-through pattern;Decreased stride length     General Gait Details: Assist to stabilize throughout distance. VCs safety, proper use of RW. Pt tolerated distance well.  Stairs            Wheelchair Mobility    Modified Rankin (Stroke Patients Only)       Balance Overall balance assessment: Needs assistance         Standing balance support: Bilateral upper extremity supported Standing balance-Leahy Scale: Poor                               Pertinent Vitals/Pain Pain  Assessment: 0-10 Pain Score: 2  Pain Location: R hip Pain Descriptors / Indicators: Sore;Discomfort Pain Intervention(s): Monitored during session;Repositioned    Home Living Family/patient expects to be discharged to:: Private residence Living Arrangements: Alone Available Help at Discharge: Family(sister) Type of Home: House Home Access: Stairs to enter   [employer] of Steps: 1 Home Layout: One level Home Equipment: Environmental consultant - 2 wheels;Cane - single point      Prior Function Level of Independence: Independent               Hand Dominance        Extremity/Trunk Assessment   Upper Extremity Assessment Upper Extremity Assessment: Overall WFL for tasks assessed    Lower Extremity Assessment Lower Extremity Assessment: Generalized weakness    Cervical / Trunk Assessment Cervical / Trunk Assessment: Normal  Communication   Communication: No difficulties  Cognition Arousal/Alertness: Awake/alert Behavior During Therapy: WFL for tasks assessed/performed Overall Cognitive Status: Within Functional Limits for tasks assessed                                        General Comments      Exercises     Assessment/Plan    PT Assessment Patient needs continued PT services  PT Problem List Decreased strength;Decreased mobility;Decreased range of motion;Decreased activity tolerance;Decreased balance;Decreased knowledge of use of DME;Pain       PT  Treatment Interventions DME instruction;Gait training;Therapeutic activities;Therapeutic exercise;Patient/family education;Balance training;Functional mobility training    PT Goals (Current goals can be found in the Care Plan section)  Acute Rehab PT Goals Patient Stated Goal: regain independence PT Goal Formulation: With patient Time For Goal Achievement: 03/24/19 Potential to Achieve Goals: Good    Frequency 7X/week   Barriers to discharge        Co-evaluation                AM-PAC PT "6 Clicks" Mobility  Outcome Measure Help needed turning from your back to your side while in a flat bed without using bedrails?: A Little Help needed moving from lying on your back to sitting on the side of a flat bed without using bedrails?: A Little Help needed moving to and from a bed to a chair (including a wheelchair)?: A Little Help needed standing up from a chair using your arms (e.g., wheelchair or bedside chair)?: A Little Help needed to walk in hospital room?: A Little Help needed climbing 3-5 steps with a railing? : A Little 6 Click Score: 18    End of Session Equipment Utilized During Treatment: Gait belt Activity Tolerance: Patient tolerated treatment well Patient left: in chair;with call bell/phone within reach;with chair alarm set   PT Visit Diagnosis: Muscle weakness (generalized) (M62.81);Other abnormalities of gait and mobility (R26.89);Pain    Time: [id number] PT Time Calculation (min) (ACUTE ONLY): 13 min   Charges:   PT Evaluation $PT Eval Low Complexity: 1 Low           Sherida Dobkins P, PT Acute Rehabilitation

## 2019-03-10 NOTE — Anesthesia Procedure Notes (Signed)
Spinal  Patient location during procedure: OR Start time: 03/10/2019 7:32 AM End time: 03/10/2019 7:37 AM Staffing Performed: anesthesiologist  Anesthesiologist: Myrtie Soman, MD Preanesthetic Checklist Completed: patient identified, IV checked, site marked, risks and benefits discussed, surgical consent, monitors and equipment checked, pre-op evaluation and timeout performed Spinal Block Patient position: sitting Prep: Betadine Patient monitoring: heart rate, continuous pulse ox and blood pressure Approach: midline Location: L3-4 Injection technique: single-shot Needle Needle type: Sprotte  Needle gauge: 24 G Needle length: 9 cm Needle insertion depth: 2 cm Additional Notes Expiration date of kit checked and confirmed. Patient tolerated procedure well, without complications.

## 2019-03-11 ENCOUNTER — Encounter: Payer: Self-pay | Admitting: *Deleted

## 2019-03-11 DIAGNOSIS — M1611 Unilateral primary osteoarthritis, right hip: Secondary | ICD-10-CM | POA: Diagnosis not present

## 2019-03-11 NOTE — Discharge Summary (Signed)
Discharge Summary  Patient ID: SECORA VILLEGAS MRN: [id number] DOB/AGE: [birth year] 77 y.o.  Admit date: 03/10/2019 Discharge date: 03/11/2019  Admission Diagnoses:  Avascular necrosis of hip, right [facility])  Discharge Diagnoses:  Principal Problem:   Avascular necrosis of hip, right ([place]) Active Problems:   Osteoarthritis of right hip   HTN (hypertension)   GERD (gastroesophageal reflux disease)   Primary osteoarthritis of hip   Past Medical History:  Diagnosis Date  . Asthma    Childhood, then some episodes 1990, No issues in 30 Years  . 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 DJD (degenerative joint disease)   . GERD (gastroesophageal reflux disease)   . Heart murmur    Slight  . History of colon polyps   . Hypertension   . OA (osteoarthritis)   . Osteopenia   . Scoliosis   . Stomach ulcer     Surgeries: Procedure(s): TOTAL HIP ARTHROPLASTY ANTERIOR APPROACH on 03/10/2019   Consultants (if any):   Discharged Condition: Improved  Hospital Course: Deanna Wright is an 79 y.o. female who was admitted 03/10/2019 with a diagnosis of Avascular necrosis of hip, right ([place]) and went to the operating room on 03/10/2019 and underwent the above named procedures.    She was given perioperative antibiotics:  Anti-infectives (From admission, onward)   Start     Dose/Rate Route Frequency Ordered Stop   03/10/19 1600  ceFAZolin (ANCEF) IVPB 1 g/50 mL premix     1 g 100 mL/hr over 30 Minutes Intravenous Every 6 hours 03/10/19 1553 03/10/19 2201   03/10/19 0605  ceFAZolin (ANCEF) 2-4 GM/100ML-% IVPB    Note to Pharmacy: Kyra Leyland   : cabinet override      03/10/19 0605 03/10/19 0746   03/10/19 0600  ceFAZolin (ANCEF) IVPB 2g/100 mL premix     2 g 200 mL/hr over 30 Minutes Intravenous On call to O.R. 03/10/19 [id number] 03/10/19 [id number]    .  She was given sequential compression devices, early ambulation, and aspirin for DVT prophylaxis.  She benefited maximally from the hospital stay  and there were no complications.    Recent vital signs:  Vitals:   03/11/19 0256 03/11/19 0550  BP: (!) 154/76 (!) 170/76  Pulse: 74 69  Resp: 16 16  Temp: 98.2 F (36.8 C) 98 F (36.7 C)  SpO2: 99% 99%    Recent laboratory studies:  Lab Results  Component Value Date   HGB 11.9 (L) 03/02/2019   Lab Results  Component Value Date   WBC 7.4 03/02/2019   PLT 252 03/02/2019   No results found for: INR Lab Results  Component Value Date   NA 140 03/02/2019   K 3.7 03/02/2019   CL 103 03/02/2019   CO2 26 03/02/2019   BUN 18 03/02/2019   CREATININE 0.72 03/02/2019   GLUCOSE 102 (H) 03/02/2019    Discharge Medications:   Allergies as of 03/11/2019      Reactions   Other Other (See Comments)   MD advised to not take muscle relaxers   Stadol [butorphanol] Nausea And Vomiting, Other (See Comments)   BP bottoms out      Medication List    STOP taking these medications   HYDROcodone-acetaminophen 10-325 MG tablet Commonly known as: NORCO     TAKE these medications   amLODipine 10 MG tablet Commonly known as: NORVASC Take 10 mg by mouth every morning.   aspirin EC 81 MG tablet Take 1 tablet (81 mg total) by mouth  2 (two) times daily. For DVT prophylaxis for 30 days after surgery.   augmented betamethasone dipropionate 0.05 % ointment Commonly known as: DIPROLENE-AF Apply 1 application topically 2 (two) times daily as needed.   docusate calcium 240 MG capsule Commonly known as: SURFAK Take 240 mg by mouth as needed for mild constipation.   ibuprofen 600 MG tablet Commonly known as: ADVIL Take 1 tablet (600 mg total) by mouth 3 (three) times daily as needed. For pain / inflammation.   losartan 100 MG tablet Commonly known as: COZAAR Take 100 mg by mouth daily.   metoprolol succinate 100 MG 24 hr tablet Commonly known as: TOPROL-XL Take 100 mg by mouth every morning.   omeprazole 20 MG capsule Commonly known as: PRILOSEC Take 20 mg by mouth daily as  needed (for heartburn/indigestion.). 3 times per week   ondansetron 4 MG tablet Commonly known as: Zofran Take 1 tablet (4 mg total) by mouth every 8 (eight) hours as needed for nausea or vomiting.   oxyCODONE 5 MG immediate release tablet Commonly known as: Roxicodone Take 1 tablet (5 mg total) by mouth every 6 (six) hours as needed for up to 7 days for breakthrough pain (For breakthrough pain not managed by chronic pain medication).       Diagnostic Studies: DG C-Arm 1-60 Min-No Report  Result Date: 03/10/2019 Fluoroscopy was utilized by the requesting physician.  No radiographic interpretation.   DG HIP OPERATIVE UNILAT W OR W/O PELVIS RIGHT  Result Date: 03/10/2019 CLINICAL DATA:  Post right total hip replacement. EXAM: OPERATIVE RIGHT HIP (WITH PELVIS IF PERFORMED) 2 VIEWS TECHNIQUE: Fluoroscopic spot image(s) were submitted for interpretation post-operatively. FLUOROSCOPY TIME:  11 seconds COMPARISON:  Right hip radiographs-06/11/2018 FINDINGS: Two spot anterior projection fluoroscopic images of the right hip and lower pelvis are provided for review Images demonstrate the sequela of right total hip replacement. Alignment appears anatomic given AP projection. No evidence of hardware failure loosening. Subcutaneous emphysema is noted about the operative site. No radiopaque foreign body. Multiple phleboliths overlie the lower pelvis bilaterally, right greater than left. Limited visualization of the contralateral left hip is normal. IMPRESSION: Post right total hip replacement without evidence of complication. Electronically Signed   By: Sandi Mariscal M.D.   On: 03/10/2019 09:40    Disposition: Discharge disposition: 01-Home or Self Care       Discharge Instructions    Discharge patient   Complete by: As directed    After A.M. Therapy Evaluation   Discharge disposition: 01-Home or Self Care   Discharge patient date: 03/11/2019      Follow-up Information    Renette Butters, MD.  Go on 03/25/2019.   Specialty: Orthopedic Surgery Why: Your appointment has been scheduled for 4:15. Contact information: [address] [place] [address] [phone]        [facility] Go on 03/12/2019.   Specialty: Physical Therapy Why: You are scheduled to start Outpatient physical therapy at 2:00. please arrive a few minutes early to complete your paperwork  Contact information: [facility] [place] [address] [phone]        Renette Butters, MD.   Specialty: Orthopedic Surgery Contact information: [address] [place] [address] [phone]            Signed: Prudencio Burly III PA-C 03/11/2019, 7:40 AM

## 2019-03-11 NOTE — Progress Notes (Signed)
Physical Therapy Treatment Patient Details Name: Deanna Wright MRN: [id number] DOB: 1940/09/14 Today's Date: 03/11/2019    History of Present Illness 79 yo female s/p R THA 03/10/19. Hx of AVN    PT Comments    Pt ambulated in hallway, practiced safe step technique and performed exercises.  Pt asked many questions and all answered within scope of practice.  Pt anticipates d/c home today.  Provided HEP handout and pt plans to f/u with OP PT.   Follow Up Recommendations  Follow surgeon's recommendation for DC plan and follow-up therapies     Equipment Recommendations  None recommended by PT    Recommendations for Other Services       Precautions / Restrictions Precautions Precautions: Fall Restrictions Other Position/Activity Restrictions: WBAT    Mobility  Bed Mobility               General bed mobility comments: pt in recliner  Transfers Overall transfer level: Needs assistance Equipment used: Rolling walker (2 wheeled) Transfers: Sit to/from Stand Sit to Stand: Supervision         General transfer comment: able to recall safe hand placement, cue to have RW near pt  Ambulation/Gait Ambulation/Gait assistance: Min guard Gait Distance (Feet): 200 Feet Assistive device: Rolling walker (2 wheeled) Gait Pattern/deviations: Step-through pattern;Decreased stride length;Antalgic     General Gait Details: verbal cues for sequence, RW positioning   Stairs Stairs: Yes Stairs assistance: Min guard Stair Management: Step to pattern;Forwards;With walker Number of Stairs: 1 General stair comments: verbal cues for safe technique and sequence, pt reports understanding   Wheelchair Mobility    Modified Rankin (Stroke Patients Only)       Balance                                            Cognition Arousal/Alertness: Awake/alert Behavior During Therapy: WFL for tasks assessed/performed Overall Cognitive Status: Within Functional  Limits for tasks assessed                                 General Comments: very talkative      Exercises Total Joint Exercises Ankle Circles/Pumps: AROM;Both;10 reps Quad Sets: AROM;Both;10 reps Heel Slides: AAROM;Right;10 reps Hip ABduction/ADduction: AROM;Supine;Standing;Right;10 reps Long Arc Quad: Seated;Right;AROM;10 reps Knee Flexion: AROM;Right;10 reps;Standing(all standing exercises using bil UE support at sink) Marching in Standing: AROM;Right;10 reps;Standing Standing Hip Extension: AROM;10 reps;Standing    General Comments        Pertinent Vitals/Pain Pain Assessment: 0-10 Pain Score: 3  Pain Location: R hip Pain Descriptors / Indicators: Sore;Discomfort Pain Intervention(s): Monitored during session;Repositioned    Home Living                      Prior Function            PT Goals (current goals can now be found in the care plan section) Progress towards PT goals: Progressing toward goals    Frequency    7X/week      PT Plan Current plan remains appropriate    Co-evaluation              AM-PAC PT "6 Clicks" Mobility   Outcome Measure  Help needed turning from your back to your side while in a flat bed without using bedrails?: A  Little Help needed moving from lying on your back to sitting on the side of a flat bed without using bedrails?: A Little Help needed moving to and from a bed to a chair (including a wheelchair)?: A Little Help needed standing up from a chair using your arms (e.g., wheelchair or bedside chair)?: A Little Help needed to walk in hospital room?: A Little Help needed climbing 3-5 steps with a railing? : A Little 6 Click Score: 18    End of Session Equipment Utilized During Treatment: Gait belt Activity Tolerance: Patient tolerated treatment well Patient left: in chair;with call bell/phone within reach;with chair alarm set   PT Visit Diagnosis: Muscle weakness (generalized) (M62.81);Other  abnormalities of gait and mobility (R26.89)     Time: 1020-1055 PT Time Calculation (min) (ACUTE ONLY): 35 min  Charges:  $Gait Training: 8-22 mins $Therapeutic Exercise: 8-22 mins                     Arlyce Dice, DPT Acute Rehabilitation Services Office: [phone]   Trena Platt 03/11/2019, 12:54 PM

## 2019-03-11 NOTE — Progress Notes (Signed)
Patient has RW, 3 in 1 ordered and delivered by [employer].

## 2019-03-11 NOTE — Progress Notes (Signed)
    Subjective: Patient denies pain.  Tolerating diet.  Urinating.  No CP, SOB.  Excellent early mobilization with therapy.  Objective:   VITALS:   Vitals:   03/10/19 1904 03/10/19 2026 03/11/19 0256 03/11/19 0550  BP: 137/64 (!) 143/72 (!) 154/76 (!) 170/76  Pulse: 74 72 74 69  Resp:  16 16 16   Temp: 98 F (36.7 C) 98.2 F (36.8 C) 98.2 F (36.8 C) 98 F (36.7 C)  TempSrc: Oral Oral Oral Oral  SpO2: 98% 99% 99% 99%  Weight:      Height:       CBC Latest Ref Rng & Units 03/02/2019  WBC 4.0 - 10.5 K/uL 7.4  Hemoglobin 12.0 - 15.0 g/dL 11.9(L)  Hematocrit 36.0 - 46.0 % 38.6  Platelets 150 - 400 K/uL 252   BMP Latest Ref Rng & Units 03/02/2019  Glucose 70 - 99 mg/dL 102(H)  BUN 8 - 23 mg/dL 18  Creatinine 0.44 - 1.00 mg/dL 0.72  Sodium 135 - 145 mmol/L 140  Potassium 3.5 - 5.1 mmol/L 3.7  Chloride 98 - 111 mmol/L 103  CO2 22 - 32 mmol/L 26  Calcium 8.9 - 10.3 mg/dL 9.3   Intake/Output      02/09 0701 - 02/10 0700 02/10 0701 - 02/11 0700   P.O. 1070    I.V. (mL/kg) 4025 (71)    IV Piggyback 327.3    Total Intake(mL/kg) 5422.3 (95.6)    Urine (mL/kg/hr) 4100 (3)    Blood 300    Total Output 4400    Net +1022.3            Physical Exam: General: NAD.  Upright in bed.  Calm, conversant. Resp: No increased wob Cardio: regular rate and rhythm ABD soft Neurologically intact MSK RLE: Neurovascularly intact Sensation intact distally Feet warm Dorsiflexion/Plantar flexion intact Incision: dressing C/D/I   Assessment: 1 Day Post-Op  S/P Procedure(s) (LRB): TOTAL HIP ARTHROPLASTY ANTERIOR APPROACH (Right) by Dr. Ernesta Amble. Percell Miller on 03/10/2019  Principal Problem:   Avascular necrosis of hip, right ([place]) Active Problems:   Osteoarthritis of right hip   HTN (hypertension)   GERD (gastroesophageal reflux disease)   Primary osteoarthritis of hip   Primary osteoarthritis, status post total hip arthroplasty Doing well postop day 1 Tolerating diet  voiding Denies any hip pain Excellent early mobilization with therapy  Plan: Up with therapy Incentive Spirometry Apply ice as needed   Weight Bearing: Weight Bearing as Tolerated (WBAT) RLE Dressings: Maintain Mepilex.   VTE prophylaxis: Aspirin, SCDs, ambulation Dispo: Home today after a.m. therapy   Prudencio Burly III, PA-C 03/11/2019, 7:38 AM

## 2019-03-11 NOTE — Progress Notes (Signed)
Discharge instructions discussed with patient, verbalized agreement and understanding 

## 2019-07-03 ENCOUNTER — Other Ambulatory Visit: Payer: Self-pay | Admitting: Neurosurgery

## 2019-07-03 DIAGNOSIS — M5416 Radiculopathy, lumbar region: Secondary | ICD-10-CM

## 2019-08-08 ENCOUNTER — Other Ambulatory Visit: Payer: Self-pay

## 2019-08-08 ENCOUNTER — Ambulatory Visit
Admission: RE | Admit: 2019-08-08 | Discharge: 2019-08-08 | Disposition: A | Discharge status: Home / Self Care | Payer: Medicare HMO | Source: Ambulatory Visit | Attending: Neurosurgery | Admitting: Neurosurgery

## 2019-08-08 DIAGNOSIS — M5416 Radiculopathy, lumbar region: Secondary | ICD-10-CM

## 2019-09-26 ENCOUNTER — Encounter: Payer: Self-pay | Admitting: Specialist

## 2020-02-04 DIAGNOSIS — M5459 Other low back pain: Secondary | ICD-10-CM | POA: Diagnosis not present

## 2020-02-04 DIAGNOSIS — M25551 Pain in right hip: Secondary | ICD-10-CM | POA: Diagnosis not present

## 2020-02-04 DIAGNOSIS — M1710 Unilateral primary osteoarthritis, unspecified knee: Secondary | ICD-10-CM | POA: Diagnosis not present

## 2020-02-04 DIAGNOSIS — G894 Chronic pain syndrome: Secondary | ICD-10-CM | POA: Diagnosis not present

## 2020-02-25 DIAGNOSIS — Z6824 Body mass index (BMI) 24.0-24.9, adult: Secondary | ICD-10-CM | POA: Diagnosis not present

## 2020-02-25 DIAGNOSIS — Z299 Encounter for prophylactic measures, unspecified: Secondary | ICD-10-CM | POA: Diagnosis not present

## 2020-02-25 DIAGNOSIS — I10 Essential (primary) hypertension: Secondary | ICD-10-CM | POA: Diagnosis not present

## 2020-02-25 DIAGNOSIS — M549 Dorsalgia, unspecified: Secondary | ICD-10-CM | POA: Diagnosis not present

## 2020-02-25 DIAGNOSIS — Z789 Other specified health status: Secondary | ICD-10-CM | POA: Diagnosis not present

## 2020-02-29 DIAGNOSIS — I10 Essential (primary) hypertension: Secondary | ICD-10-CM | POA: Diagnosis not present

## 2020-03-03 DIAGNOSIS — G894 Chronic pain syndrome: Secondary | ICD-10-CM | POA: Diagnosis not present

## 2020-03-03 DIAGNOSIS — Z79891 Long term (current) use of opiate analgesic: Secondary | ICD-10-CM | POA: Diagnosis not present

## 2020-03-03 DIAGNOSIS — M5459 Other low back pain: Secondary | ICD-10-CM | POA: Diagnosis not present

## 2020-03-03 DIAGNOSIS — M25551 Pain in right hip: Secondary | ICD-10-CM | POA: Diagnosis not present

## 2020-03-03 DIAGNOSIS — M1710 Unilateral primary osteoarthritis, unspecified knee: Secondary | ICD-10-CM | POA: Diagnosis not present

## 2020-03-03 DIAGNOSIS — Z79899 Other long term (current) drug therapy: Secondary | ICD-10-CM | POA: Diagnosis not present

## 2020-03-28 DIAGNOSIS — I10 Essential (primary) hypertension: Secondary | ICD-10-CM | POA: Diagnosis not present

## 2020-03-31 DIAGNOSIS — M1712 Unilateral primary osteoarthritis, left knee: Secondary | ICD-10-CM | POA: Diagnosis not present

## 2020-03-31 DIAGNOSIS — Z79899 Other long term (current) drug therapy: Secondary | ICD-10-CM | POA: Diagnosis not present

## 2020-03-31 DIAGNOSIS — G894 Chronic pain syndrome: Secondary | ICD-10-CM | POA: Diagnosis not present

## 2020-03-31 DIAGNOSIS — Z79891 Long term (current) use of opiate analgesic: Secondary | ICD-10-CM | POA: Diagnosis not present

## 2020-04-07 DIAGNOSIS — I10 Essential (primary) hypertension: Secondary | ICD-10-CM | POA: Diagnosis not present

## 2020-04-07 DIAGNOSIS — Z299 Encounter for prophylactic measures, unspecified: Secondary | ICD-10-CM | POA: Diagnosis not present

## 2020-04-07 DIAGNOSIS — L309 Dermatitis, unspecified: Secondary | ICD-10-CM | POA: Diagnosis not present

## 2020-04-07 DIAGNOSIS — M549 Dorsalgia, unspecified: Secondary | ICD-10-CM | POA: Diagnosis not present

## 2020-04-26 DIAGNOSIS — M1712 Unilateral primary osteoarthritis, left knee: Secondary | ICD-10-CM | POA: Diagnosis not present

## 2020-04-26 DIAGNOSIS — M25551 Pain in right hip: Secondary | ICD-10-CM | POA: Diagnosis not present

## 2020-04-26 DIAGNOSIS — M545 Low back pain, unspecified: Secondary | ICD-10-CM | POA: Diagnosis not present

## 2020-04-26 DIAGNOSIS — G894 Chronic pain syndrome: Secondary | ICD-10-CM | POA: Diagnosis not present

## 2020-04-28 DIAGNOSIS — I10 Essential (primary) hypertension: Secondary | ICD-10-CM | POA: Diagnosis not present

## 2020-05-17 DIAGNOSIS — M5459 Other low back pain: Secondary | ICD-10-CM | POA: Diagnosis not present

## 2020-05-17 DIAGNOSIS — G894 Chronic pain syndrome: Secondary | ICD-10-CM | POA: Diagnosis not present

## 2020-05-17 DIAGNOSIS — M25569 Pain in unspecified knee: Secondary | ICD-10-CM | POA: Diagnosis not present

## 2020-05-17 DIAGNOSIS — M25551 Pain in right hip: Secondary | ICD-10-CM | POA: Diagnosis not present

## 2020-05-25 DIAGNOSIS — M4126 Other idiopathic scoliosis, lumbar region: Secondary | ICD-10-CM | POA: Diagnosis not present

## 2020-05-26 DIAGNOSIS — Z299 Encounter for prophylactic measures, unspecified: Secondary | ICD-10-CM | POA: Diagnosis not present

## 2020-05-26 DIAGNOSIS — Z6827 Body mass index (BMI) 27.0-27.9, adult: Secondary | ICD-10-CM | POA: Diagnosis not present

## 2020-05-26 DIAGNOSIS — M549 Dorsalgia, unspecified: Secondary | ICD-10-CM | POA: Diagnosis not present

## 2020-05-26 DIAGNOSIS — I10 Essential (primary) hypertension: Secondary | ICD-10-CM | POA: Diagnosis not present

## 2020-05-27 DIAGNOSIS — I10 Essential (primary) hypertension: Secondary | ICD-10-CM | POA: Diagnosis not present

## 2020-06-23 DIAGNOSIS — Z79899 Other long term (current) drug therapy: Secondary | ICD-10-CM | POA: Diagnosis not present

## 2020-06-23 DIAGNOSIS — M79659 Pain in unspecified thigh: Secondary | ICD-10-CM | POA: Diagnosis not present

## 2020-06-23 DIAGNOSIS — M1710 Unilateral primary osteoarthritis, unspecified knee: Secondary | ICD-10-CM | POA: Diagnosis not present

## 2020-06-23 DIAGNOSIS — Z79891 Long term (current) use of opiate analgesic: Secondary | ICD-10-CM | POA: Diagnosis not present

## 2020-06-23 DIAGNOSIS — G894 Chronic pain syndrome: Secondary | ICD-10-CM | POA: Diagnosis not present

## 2020-06-23 DIAGNOSIS — M25551 Pain in right hip: Secondary | ICD-10-CM | POA: Diagnosis not present

## 2020-06-26 DIAGNOSIS — H1032 Unspecified acute conjunctivitis, left eye: Secondary | ICD-10-CM | POA: Diagnosis not present

## 2020-06-26 DIAGNOSIS — S0502XA Injury of conjunctiva and corneal abrasion without foreign body, left eye, initial encounter: Secondary | ICD-10-CM | POA: Diagnosis not present

## 2020-06-26 DIAGNOSIS — H11422 Conjunctival edema, left eye: Secondary | ICD-10-CM | POA: Diagnosis not present

## 2020-06-28 DIAGNOSIS — I10 Essential (primary) hypertension: Secondary | ICD-10-CM | POA: Diagnosis not present

## 2020-07-13 ENCOUNTER — Other Ambulatory Visit: Payer: Self-pay | Admitting: Physician Assistant

## 2020-07-13 ENCOUNTER — Ambulatory Visit
Admission: RE | Admit: 2020-07-13 | Discharge: 2020-07-13 | Disposition: A | Discharge status: Home / Self Care | Payer: Medicare HMO | Source: Ambulatory Visit | Attending: Physician Assistant | Admitting: Physician Assistant

## 2020-07-13 DIAGNOSIS — M549 Dorsalgia, unspecified: Secondary | ICD-10-CM

## 2020-07-13 DIAGNOSIS — M545 Low back pain, unspecified: Secondary | ICD-10-CM | POA: Diagnosis not present

## 2020-07-13 DIAGNOSIS — M25562 Pain in left knee: Secondary | ICD-10-CM

## 2020-07-13 DIAGNOSIS — M1712 Unilateral primary osteoarthritis, left knee: Secondary | ICD-10-CM | POA: Diagnosis not present

## 2020-07-13 DIAGNOSIS — M25462 Effusion, left knee: Secondary | ICD-10-CM | POA: Diagnosis not present

## 2020-07-26 DIAGNOSIS — M79659 Pain in unspecified thigh: Secondary | ICD-10-CM | POA: Diagnosis not present

## 2020-07-26 DIAGNOSIS — G894 Chronic pain syndrome: Secondary | ICD-10-CM | POA: Diagnosis not present

## 2020-07-26 DIAGNOSIS — M1712 Unilateral primary osteoarthritis, left knee: Secondary | ICD-10-CM | POA: Diagnosis not present

## 2020-07-26 DIAGNOSIS — M4727 Other spondylosis with radiculopathy, lumbosacral region: Secondary | ICD-10-CM | POA: Diagnosis not present

## 2020-07-26 DIAGNOSIS — M25551 Pain in right hip: Secondary | ICD-10-CM | POA: Diagnosis not present

## 2020-07-28 DIAGNOSIS — I10 Essential (primary) hypertension: Secondary | ICD-10-CM | POA: Diagnosis not present

## 2020-08-19 DIAGNOSIS — E7800 Pure hypercholesterolemia, unspecified: Secondary | ICD-10-CM | POA: Diagnosis not present

## 2020-08-19 DIAGNOSIS — R5383 Other fatigue: Secondary | ICD-10-CM | POA: Diagnosis not present

## 2020-08-19 DIAGNOSIS — Z7189 Other specified counseling: Secondary | ICD-10-CM | POA: Diagnosis not present

## 2020-08-19 DIAGNOSIS — Z79899 Other long term (current) drug therapy: Secondary | ICD-10-CM | POA: Diagnosis not present

## 2020-08-19 DIAGNOSIS — Z1339 Encounter for screening examination for other mental health and behavioral disorders: Secondary | ICD-10-CM | POA: Diagnosis not present

## 2020-08-19 DIAGNOSIS — M1710 Unilateral primary osteoarthritis, unspecified knee: Secondary | ICD-10-CM | POA: Diagnosis not present

## 2020-08-19 DIAGNOSIS — Z1331 Encounter for screening for depression: Secondary | ICD-10-CM | POA: Diagnosis not present

## 2020-08-19 DIAGNOSIS — Z6826 Body mass index (BMI) 26.0-26.9, adult: Secondary | ICD-10-CM | POA: Diagnosis not present

## 2020-08-19 DIAGNOSIS — I10 Essential (primary) hypertension: Secondary | ICD-10-CM | POA: Diagnosis not present

## 2020-08-19 DIAGNOSIS — Z299 Encounter for prophylactic measures, unspecified: Secondary | ICD-10-CM | POA: Diagnosis not present

## 2020-08-19 DIAGNOSIS — Z0000 Encounter for general adult medical examination without abnormal findings: Secondary | ICD-10-CM | POA: Diagnosis not present

## 2020-08-19 DIAGNOSIS — E559 Vitamin D deficiency, unspecified: Secondary | ICD-10-CM | POA: Diagnosis not present

## 2020-08-23 DIAGNOSIS — M4727 Other spondylosis with radiculopathy, lumbosacral region: Secondary | ICD-10-CM | POA: Diagnosis not present

## 2020-08-23 DIAGNOSIS — M1712 Unilateral primary osteoarthritis, left knee: Secondary | ICD-10-CM | POA: Diagnosis not present

## 2020-08-23 DIAGNOSIS — G894 Chronic pain syndrome: Secondary | ICD-10-CM | POA: Diagnosis not present

## 2020-08-23 DIAGNOSIS — M25551 Pain in right hip: Secondary | ICD-10-CM | POA: Diagnosis not present

## 2020-08-23 DIAGNOSIS — M79659 Pain in unspecified thigh: Secondary | ICD-10-CM | POA: Diagnosis not present

## 2020-08-26 DIAGNOSIS — I10 Essential (primary) hypertension: Secondary | ICD-10-CM | POA: Diagnosis not present

## 2020-08-28 DIAGNOSIS — I10 Essential (primary) hypertension: Secondary | ICD-10-CM | POA: Diagnosis not present

## 2020-09-20 DIAGNOSIS — M25551 Pain in right hip: Secondary | ICD-10-CM | POA: Diagnosis not present

## 2020-09-20 DIAGNOSIS — G894 Chronic pain syndrome: Secondary | ICD-10-CM | POA: Diagnosis not present

## 2020-09-20 DIAGNOSIS — M4727 Other spondylosis with radiculopathy, lumbosacral region: Secondary | ICD-10-CM | POA: Diagnosis not present

## 2020-09-20 DIAGNOSIS — M1710 Unilateral primary osteoarthritis, unspecified knee: Secondary | ICD-10-CM | POA: Diagnosis not present

## 2020-09-28 DIAGNOSIS — I10 Essential (primary) hypertension: Secondary | ICD-10-CM | POA: Diagnosis not present

## 2020-10-20 DIAGNOSIS — G894 Chronic pain syndrome: Secondary | ICD-10-CM | POA: Diagnosis not present

## 2020-10-20 DIAGNOSIS — M4727 Other spondylosis with radiculopathy, lumbosacral region: Secondary | ICD-10-CM | POA: Diagnosis not present

## 2020-10-20 DIAGNOSIS — M25551 Pain in right hip: Secondary | ICD-10-CM | POA: Diagnosis not present

## 2020-10-20 DIAGNOSIS — M1710 Unilateral primary osteoarthritis, unspecified knee: Secondary | ICD-10-CM | POA: Diagnosis not present

## 2020-10-28 DIAGNOSIS — I10 Essential (primary) hypertension: Secondary | ICD-10-CM | POA: Diagnosis not present

## 2020-11-17 DIAGNOSIS — M25551 Pain in right hip: Secondary | ICD-10-CM | POA: Diagnosis not present

## 2020-11-17 DIAGNOSIS — Z79891 Long term (current) use of opiate analgesic: Secondary | ICD-10-CM | POA: Diagnosis not present

## 2020-11-17 DIAGNOSIS — M1712 Unilateral primary osteoarthritis, left knee: Secondary | ICD-10-CM | POA: Diagnosis not present

## 2020-11-17 DIAGNOSIS — Z79899 Other long term (current) drug therapy: Secondary | ICD-10-CM | POA: Diagnosis not present

## 2020-11-17 DIAGNOSIS — G894 Chronic pain syndrome: Secondary | ICD-10-CM | POA: Diagnosis not present

## 2020-11-17 DIAGNOSIS — M4727 Other spondylosis with radiculopathy, lumbosacral region: Secondary | ICD-10-CM | POA: Diagnosis not present

## 2020-11-28 DIAGNOSIS — I10 Essential (primary) hypertension: Secondary | ICD-10-CM | POA: Diagnosis not present

## 2020-12-15 DIAGNOSIS — M4727 Other spondylosis with radiculopathy, lumbosacral region: Secondary | ICD-10-CM | POA: Diagnosis not present

## 2020-12-15 DIAGNOSIS — M1710 Unilateral primary osteoarthritis, unspecified knee: Secondary | ICD-10-CM | POA: Diagnosis not present

## 2020-12-15 DIAGNOSIS — G894 Chronic pain syndrome: Secondary | ICD-10-CM | POA: Diagnosis not present

## 2020-12-15 DIAGNOSIS — Z79891 Long term (current) use of opiate analgesic: Secondary | ICD-10-CM | POA: Diagnosis not present

## 2020-12-15 DIAGNOSIS — M25551 Pain in right hip: Secondary | ICD-10-CM | POA: Diagnosis not present

## 2020-12-15 DIAGNOSIS — M25559 Pain in unspecified hip: Secondary | ICD-10-CM | POA: Diagnosis not present

## 2020-12-15 DIAGNOSIS — M79659 Pain in unspecified thigh: Secondary | ICD-10-CM | POA: Diagnosis not present

## 2020-12-15 DIAGNOSIS — M1712 Unilateral primary osteoarthritis, left knee: Secondary | ICD-10-CM | POA: Diagnosis not present

## 2020-12-15 DIAGNOSIS — Z79899 Other long term (current) drug therapy: Secondary | ICD-10-CM | POA: Diagnosis not present

## 2020-12-15 DIAGNOSIS — M549 Dorsalgia, unspecified: Secondary | ICD-10-CM | POA: Diagnosis not present

## 2020-12-20 DIAGNOSIS — I809 Phlebitis and thrombophlebitis of unspecified site: Secondary | ICD-10-CM | POA: Diagnosis not present

## 2020-12-20 DIAGNOSIS — Z23 Encounter for immunization: Secondary | ICD-10-CM | POA: Diagnosis not present

## 2020-12-20 DIAGNOSIS — Z299 Encounter for prophylactic measures, unspecified: Secondary | ICD-10-CM | POA: Diagnosis not present

## 2020-12-20 DIAGNOSIS — L309 Dermatitis, unspecified: Secondary | ICD-10-CM | POA: Diagnosis not present

## 2020-12-20 DIAGNOSIS — I10 Essential (primary) hypertension: Secondary | ICD-10-CM | POA: Diagnosis not present

## 2020-12-20 DIAGNOSIS — Z6826 Body mass index (BMI) 26.0-26.9, adult: Secondary | ICD-10-CM | POA: Diagnosis not present

## 2020-12-28 DIAGNOSIS — I10 Essential (primary) hypertension: Secondary | ICD-10-CM | POA: Diagnosis not present

## 2021-01-04 DIAGNOSIS — M1712 Unilateral primary osteoarthritis, left knee: Secondary | ICD-10-CM | POA: Diagnosis not present

## 2021-01-09 DIAGNOSIS — I8003 Phlebitis and thrombophlebitis of superficial vessels of lower extremities, bilateral: Secondary | ICD-10-CM | POA: Diagnosis not present

## 2021-01-09 DIAGNOSIS — I8001 Phlebitis and thrombophlebitis of superficial vessels of right lower extremity: Secondary | ICD-10-CM | POA: Diagnosis not present

## 2021-01-16 DIAGNOSIS — M25551 Pain in right hip: Secondary | ICD-10-CM | POA: Diagnosis not present

## 2021-01-16 DIAGNOSIS — M1710 Unilateral primary osteoarthritis, unspecified knee: Secondary | ICD-10-CM | POA: Diagnosis not present

## 2021-01-16 DIAGNOSIS — M25569 Pain in unspecified knee: Secondary | ICD-10-CM | POA: Diagnosis not present

## 2021-01-27 DIAGNOSIS — I10 Essential (primary) hypertension: Secondary | ICD-10-CM | POA: Diagnosis not present

## 2021-01-27 DIAGNOSIS — M1712 Unilateral primary osteoarthritis, left knee: Secondary | ICD-10-CM | POA: Diagnosis not present

## 2021-02-03 DIAGNOSIS — Z789 Other specified health status: Secondary | ICD-10-CM | POA: Diagnosis not present

## 2021-02-03 DIAGNOSIS — Z6826 Body mass index (BMI) 26.0-26.9, adult: Secondary | ICD-10-CM | POA: Diagnosis not present

## 2021-02-03 DIAGNOSIS — I10 Essential (primary) hypertension: Secondary | ICD-10-CM | POA: Diagnosis not present

## 2021-02-03 DIAGNOSIS — Z299 Encounter for prophylactic measures, unspecified: Secondary | ICD-10-CM | POA: Diagnosis not present

## 2021-02-03 DIAGNOSIS — M1710 Unilateral primary osteoarthritis, unspecified knee: Secondary | ICD-10-CM | POA: Diagnosis not present

## 2021-02-14 DIAGNOSIS — M25551 Pain in right hip: Secondary | ICD-10-CM | POA: Diagnosis not present

## 2021-02-14 DIAGNOSIS — G894 Chronic pain syndrome: Secondary | ICD-10-CM | POA: Diagnosis not present

## 2021-02-14 DIAGNOSIS — M4727 Other spondylosis with radiculopathy, lumbosacral region: Secondary | ICD-10-CM | POA: Diagnosis not present

## 2021-02-14 DIAGNOSIS — M1710 Unilateral primary osteoarthritis, unspecified knee: Secondary | ICD-10-CM | POA: Diagnosis not present

## 2021-02-26 DIAGNOSIS — I10 Essential (primary) hypertension: Secondary | ICD-10-CM | POA: Diagnosis not present

## 2021-02-27 DIAGNOSIS — M1712 Unilateral primary osteoarthritis, left knee: Secondary | ICD-10-CM | POA: Diagnosis not present

## 2021-03-06 NOTE — H&P (Signed)
KNEE ARTHROPLASTY ADMISSION H&P  Patient ID: JENESSA GILLINGHAM MRN: [id number] DOB/AGE: [birth year] 81 y.o.  Chief Complaint: left knee pain.  Planned Procedure Date: 03/21/21 Medical and Cardiac Clearance by Dr. Monico Blitz     HPI: Deanna Wright is a 81 y.o. female who presents for evaluation of OA LEFT KNEE. The patient has a history of pain and functional disability in the left knee due to arthritis and has failed non-surgical conservative treatments for greater than 12 weeks to include NSAID's and/or analgesics, corticosteriod injections, viscosupplementation injections, use of assistive devices, and activity modification.  Onset of symptoms was gradual, starting 2 years ago with gradually worsening course since that time. The patient noted no past surgery on the left knee.  Patient currently rates pain at 9 out of 10 with activity. Patient has night pain, worsening of pain with activity and weight bearing, and pain that interferes with activities of daily living.  Patient has evidence of subchondral sclerosis, periarticular osteophytes, and joint space narrowing by imaging studies.  There is no active infection.  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   DJD (degenerative joint disease)    GERD (gastroesophageal reflux disease)    Heart murmur    Slight   History of colon polyps    Hypertension    OA (osteoarthritis)    Osteopenia    Scoliosis    Stomach ulcer    Past Surgical History:  Procedure Laterality Date   APPENDECTOMY     COLONOSCOPY     ENDOSCOPIC RETROGRADE CHOLANGIOPANCREATOGRAPHY (ERCP) WITH PROPOFOL N/A 01/17/2017   Procedure: ENDOSCOPIC RETROGRADE CHOLANGIOPANCREATOGRAPHY (ERCP) WITH PROPOFOL;  Surgeon: Carol Ada, MD;  Location: [facility] ENDOSCOPY;  Service: Endoscopy;  Laterality: N/A;   EUS N/A 01/01/2017   Procedure: UPPER ENDOSCOPIC ULTRASOUND (EUS) LINEAR;   Surgeon: Carol Ada, MD;  Location: [facility] ENDOSCOPY;  Service: Endoscopy;  Laterality: N/A;   KNEE SURGERY Right    TOTAL HIP ARTHROPLASTY Right 03/10/2019   Procedure: TOTAL HIP ARTHROPLASTY ANTERIOR APPROACH;  Surgeon: Renette Butters, MD;  Location: [facility] ORS;  Service: Orthopedics;  Laterality: Right;   Allergies  Allergen Reactions   Diflunisal    Other Other (See Comments)    MD advised to not take muscle relaxers   Stadol [Butorphanol] Nausea And Vomiting and Other (See Comments)    BP bottoms out   Prior to Admission medications   Medication Sig Start Date End Date Taking? Authorizing Provider  amLODipine (NORVASC) 10 MG tablet Take 10-15 mg by mouth every morning. Depends on blood pressure 10/14/18  Yes [provider]  BIOTIN PO Take 1 tablet by mouth 2 (two) times a week.   Yes [provider]  Cholecalciferol (VITAMIN D3 PO) Take 1 capsule by mouth daily.   Yes [provider]  docusate calcium (SURFAK) 240 MG capsule Take 240 mg by mouth as needed for mild constipation.   Yes [provider]  MECLIZINE HCL PO Take 1 tablet by mouth as needed (Dizziness).   Yes [provider]  metoprolol succinate (TOPROL-XL) 100 MG 24 hr tablet Take 100-150 mg by mouth every morning. Depends on the blood pressure 11/28/16  Yes [provider]  omeprazole (PRILOSEC) 20 MG capsule Take 20 mg by mouth daily as needed (for heartburn/indigestion.). 3 times per week   Yes [provider]  oxyCODONE-acetaminophen (PERCOCET) 10-325 MG tablet  Take 1-2 tablets by mouth 3 (three) times daily. Additional of needed 12/15/20  Yes [provider]  aspirin EC 81 MG tablet Take 1 tablet (81 mg total) by mouth 2 (two) times daily. For DVT prophylaxis for 30 days after surgery. 03/10/19   Prudencio Burly III, PA-C  augmented betamethasone dipropionate (DIPROLENE-AF) 0.05 % ointment Apply 1 application topically 2 (two) times daily as  needed. Patient not taking: Reported on 03/06/2021 02/17/19   [provider]  ondansetron (ZOFRAN) 4 MG tablet Take 1 tablet (4 mg total) by mouth every 8 (eight) hours as needed for nausea or vomiting. Patient not taking: Reported on 03/06/2021 03/10/19   Prudencio Burly III, PA-C   Social History   Socioeconomic History   Marital status: Married    Spouse name: Not on file   Number of children: Not on file   Years of education: Not on file   Highest education level: Not on file  Occupational History   Not on file  Tobacco Use   Smoking status: Never   Smokeless tobacco: Never  Vaping Use   Vaping Use: Never used  Substance and Sexual Activity   Alcohol use: No   Drug use: No   Sexual activity: Never    Birth control/protection: Post-menopausal  Other Topics Concern   Not on file  Social History Narrative   Not on file   Social Determinants of Health   Financial Resource Strain: Not on file  Food Insecurity: Not on file  Transportation Needs: Not on file  Physical Activity: Not on file  Stress: Not on file  Social Connections: Not on file   No family history on file.  ROS: Currently denies lightheadedness, dizziness, Fever, chills, CP, SOB.   No personal history of DVT, PE, MI, or CVA. No loose teeth or dentures All other systems have been reviewed and were otherwise currently negative with the exception of those mentioned in the HPI and as above.  Objective: Vitals: Ht: 5'0" Wt: 130 lbs Temp: 98.2 BP: 130/78 Pulse: 74 O2 99% on room air.   Physical Exam: General: Alert, NAD.  Antalgic Gait  HEENT: EOMI, Good Neck Extension  Pulm: No increased work of breathing.  Clear B/L A/P w/o crackle or wheeze.  CV: RRR, No m/g/r appreciated  GI: soft, NT, ND. BS x 4 quadrants Neuro: CN II-XII grossly intact without focal deficit.  Sensation intact distally Skin: No lesions in the area of chief complaint MSK/Surgical Site:  + JLT. ROM limited d/t pain.   Decreased strength in extension and flexion.  +EHL/FHL.  NVI.  Pain and instability with varus and valgus stress.    Imaging Review Plain radiographs demonstrate severe degenerative joint disease of the left knee.   The overall alignment isneutral. The bone quality appears to be fair for age and reported activity level.  Preoperative templating of the joint replacement has been completed, documented, and submitted to the Operating Room personnel in order to optimize intra-operative equipment management.  Assessment: OA LEFT KNEE Active Problems:   * No active hospital problems. *   Plan: Plan for Procedure(s): TOTAL KNEE ARTHROPLASTY  The patient history, physical exam, clinical judgement of the provider and imaging are consistent with end stage degenerative joint disease and total joint arthroplasty is deemed medically necessary. The treatment options including medical management, injection therapy, and arthroplasty were discussed at length. The risks and benefits of Procedure(s): TOTAL KNEE ARTHROPLASTY were presented and reviewed.  The risks of nonoperative  treatment, versus surgical intervention including but not limited to continued pain, aseptic loosening, stiffness, dislocation/subluxation, infection, bleeding, nerve injury, blood clots, cardiopulmonary complications, morbidity, mortality, among others were discussed. The patient verbalizes understanding and wishes to proceed with the plan.  Patient is being admitted for inpatient treatment for surgery, pain control, PT, prophylactic antibiotics, VTE prophylaxis, progressive ambulation, ADL's and discharge planning. She will spend the night in observation.  Dental prophylaxis discussed and recommended for 2 years postoperatively.  The patient does meet the criteria for TXA which will be used perioperatively.   ASA 81 mg BID will be used postoperatively for DVT prophylaxis in addition to SCDs, and early ambulation. Plan for  hydrocodone only for pain.  Can't take Tylenol or NSAIDs.  Can't take muscle relaxers d/t severe hypotension.   Zofran for nausea and vomiting. May require post-op anxiolytic medicine  Pharmacy- [facility] in [place] The patient is planning to be discharged home with OPPT and into the care of her sister Zigmund Daniel who can be reached at [phone]. Follow up appt 04/05/21 at 4:15pm     Alisa Graff Office [phone] 03/06/2021 5:30 PM

## 2021-03-08 NOTE — Patient Instructions (Signed)
DUE TO COVID-19 ONLY ONE VISITOR IS ALLOWED TO COME WITH YOU AND STAY IN THE WAITING ROOM ONLY DURING PRE OP AND PROCEDURE.   **NO VISITORS ARE ALLOWED IN THE SHORT STAY AREA OR RECOVERY ROOM!!**  IF YOU WILL BE ADMITTED INTO THE HOSPITAL YOU ARE ALLOWED ONLY TWO SUPPORT PEOPLE DURING VISITATION HOURS ONLY (7 AM -8PM)   The support person(s) must pass our screening, gel in and out, and wear a mask at all times, including in the patients room. Patients must also wear a mask when staff or their support person are in the room. Visitors GUEST BADGE MUST BE WORN VISIBLY  One adult visitor may remain with you overnight and MUST be in the room by 8 P.M.  No visitors under the age of 37. Any visitor under the age of 86 must be accompanied by an adult.    COVID SWAB TESTING MUST BE COMPLETED ON:  03/17/21   Site: [facility] [place] Enter: Main Entrance have a seat in the waiting area to the right of main entrance (DO NOT [place]!!!!!) Dial: [phone] to alert staff you have arrived  You are not required to quarantine, however you are required to wear a well-fitted mask when you are out and around people not in your household.  Hand Hygiene often Do NOT share personal items Notify your provider if you are in close contact with someone who has COVID or you develop fever 100.4 or greater, new onset of sneezing, cough, sore throat, shortness of breath or body aches.  [place] [address], [address], must go inside of the hospital, NOT A DRIVE THRU!  (Must self quarantine after testing. Follow instructions on handout.)       Your procedure is scheduled on: 03/21/21   Report to [facility] Main Entrance    Report to admitting at : 7:15 AM   Call this number if you have problems the morning of surgery [phone]   Do not eat food :After Midnight.   May have liquids until: 7:00 AM     day of surgery  CLEAR LIQUID DIET  Foods Allowed                                                                     Foods Excluded  Water, Black Coffee and tea, regular and decaf                             liquids that you cannot  Plain Jell-O in any flavor  (No red)                                           see through such as: Fruit ices (not with fruit pulp)                                     milk, soups, orange juice  Iced Popsicles (No red)                                    All solid food                                   Apple juices Sports drinks like Gatorade (No red) Lightly seasoned clear broth or consume(fat free) Sugar Sample Menu Breakfast                                Lunch                                     Supper Cranberry juice                    Beef broth                            Chicken broth Jell-O                                     Grape juice                           Apple juice Coffee or tea                        Jell-O                                      Popsicle                                                Coffee or tea                        Coffee or tea    Complete one Ensure drink the morning of surgery 3 hours prior to scheduled surgery at : 7:00 AM.    The day of surgery:  Drink ONE (1) Pre-Surgery Clear Ensure or G2 at AM the morning of surgery. Drink in one sitting. Do not sip.  This drink was given to you during your hospital  pre-op appointment visit. Nothing else to drink after completing the  Pre-Surgery Clear Ensure or G2.          If you have questions, please contact your surgeons office   Oral Hygiene is also important to reduce your risk of infection.                                    Remember - BRUSH YOUR TEETH THE MORNING OF SURGERY WITH YOUR REGULAR TOOTHPASTE   Do NOT smoke after Midnight   Take these medicines the morning of surgery with A SIP OF  WATER: metoprolol,amlodipine,omeprazole.  DO NOT TAKE  ANY ORAL DIABETIC MEDICATIONS DAY OF YOUR SURGERY                              You may not have any metal on your body including hair pins, jewelry, and body piercing             Do not wear make-up, lotions, powders, perfumes/cologne, or deodorant  Do not wear nail polish including gel and S&S, artificial/acrylic nails, or any other type of covering on natural nails including finger and toenails. If you have artificial nails, gel coating, etc. that needs to be removed by a nail salon please have this removed prior to surgery or surgery may need to be canceled/ delayed if the surgeon/ anesthesia feels like they are unable to be safely monitored.   Do not shave  48 hours prior to surgery.   Do not bring valuables to the hospital. [place].   Contacts, dentures or bridgework may not be worn into surgery.   Bring small overnight bag day of surgery.    Patients discharged on the day of surgery will not be allowed to drive home.  Someone needs to stay with you for the first 24 hours after anesthesia.   Special Instructions: Bring a copy of your healthcare power of attorney and living will documents         the day of surgery if you haven't scanned them before.              Please read over the following fact sheets you were given: IF YOU HAVE QUESTIONS ABOUT YOUR PRE-OP INSTRUCTIONS PLEASE CALL [phone]     [facility] - Preparing for Surgery Before surgery, you can play an important role.  Because skin is not sterile, your skin needs to be as free of germs as possible.  You can reduce the number of germs on your skin by washing with CHG (chlorahexidine gluconate) soap before surgery.  CHG is an antiseptic cleaner which kills germs and bonds with the skin to continue killing germs even after washing. Please DO NOT use if you have an allergy to CHG or antibacterial soaps.  If your skin becomes reddened/irritated stop using the CHG and inform  your nurse when you arrive at Short Stay. Do not shave (including legs and underarms) for at least 48 hours prior to the first CHG shower.  You may shave your face/neck. Please follow these instructions carefully:  1.  Shower with CHG Soap the night before surgery and the  morning of Surgery.  2.  If you choose to wash your hair, wash your hair first as usual with your  normal  shampoo.  3.  After you shampoo, rinse your hair and body thoroughly to remove the  shampoo.                           4.  Use CHG as you would any other liquid soap.  You can apply chg directly  to the skin and wash                       Gently with a scrungie or clean washcloth.  5.  Apply the CHG Soap to your body ONLY FROM THE  NECK DOWN.   Do not use on face/ open                           Wound or open sores. Avoid contact with eyes, ears mouth and genitals (private parts).                       Wash face,  Genitals (private parts) with your normal soap.             6.  Wash thoroughly, paying special attention to the area where your surgery  will be performed.  7.  Thoroughly rinse your body with warm water from the neck down.  8.  DO NOT shower/wash with your normal soap after using and rinsing off  the CHG Soap.                9.  Pat yourself dry with a clean towel.            10.  Wear clean pajamas.            11.  Place clean sheets on your bed the night of your first shower and do not  sleep with pets. Day of Surgery : Do not apply any lotions/deodorants the morning of surgery.  Please wear clean clothes to the hospital/surgery center.  FAILURE TO FOLLOW THESE INSTRUCTIONS MAY RESULT IN THE CANCELLATION OF YOUR SURGERY PATIENT SIGNATURE_________________________________  NURSE SIGNATURE__________________________________  ________________________________________________________________________   Adam Phenix  An incentive spirometer is a tool that can help keep your lungs clear and active. This  tool measures how well you are filling your lungs with each breath. Taking long deep breaths may help reverse or decrease the chance of developing breathing (pulmonary) problems (especially infection) following: A long period of time when you are unable to move or be active. BEFORE THE PROCEDURE  If the spirometer includes an indicator to show your best effort, your nurse or respiratory therapist will set it to a desired goal. If possible, sit up straight or lean slightly forward. Try not to slouch. Hold the incentive spirometer in an upright position. INSTRUCTIONS FOR USE  Sit on the edge of your bed if possible, or sit up as far as you can in bed or on a chair. Hold the incentive spirometer in an upright position. Breathe out normally. Place the mouthpiece in your mouth and seal your lips tightly around it. Breathe in slowly and as deeply as possible, raising the piston or the ball toward the top of the column. Hold your breath for 3-5 seconds or for as long as possible. Allow the piston or ball to fall to the bottom of the column. Remove the mouthpiece from your mouth and breathe out normally. Rest for a few seconds and repeat Steps 1 through 7 at least 10 times every 1-2 hours when you are awake. Take your time and take a few normal breaths between deep breaths. The spirometer may include an indicator to show your best effort. Use the indicator as a goal to work toward during each repetition. After each set of 10 deep breaths, practice coughing to be sure your lungs are clear. If you have an incision (the cut made at the time of surgery), support your incision when coughing by placing a pillow or rolled up towels firmly against it. Once you are able to get out of bed, walk around indoors and cough well. You may stop  using the incentive spirometer when instructed by your caregiver.  RISKS AND COMPLICATIONS Take your time so you do not get dizzy or light-headed. If you are in pain, you may need  to take or ask for pain medication before doing incentive spirometry. It is harder to take a deep breath if you are having pain. AFTER USE Rest and breathe slowly and easily. It can be helpful to keep track of a log of your progress. Your caregiver can provide you with a simple table to help with this. If you are using the spirometer at home, follow these instructions: [place] IF:  You are having difficultly using the spirometer. You have trouble using the spirometer as often as instructed. Your pain medication is not giving enough relief while using the spirometer. You develop fever of 100.5 F (38.1 C) or higher. SEEK IMMEDIATE MEDICAL CARE IF:  You cough up bloody sputum that had not been present before. You develop fever of 102 F (38.9 C) or greater. You develop worsening pain at or near the incision site. MAKE SURE YOU:  Understand these instructions. Will watch your condition. Will get help right away if you are not doing well or get worse. Document Released: 05/28/2006 Document Revised: 04/09/2011 Document Reviewed: 07/29/2006 [facility] Patient Information 2014 [place], [place].   ________________________________________________________________________

## 2021-03-09 ENCOUNTER — Other Ambulatory Visit: Payer: Self-pay

## 2021-03-09 ENCOUNTER — Encounter (HOSPITAL_COMMUNITY)
Admission: RE | Admit: 2021-03-09 | Discharge: 2021-03-09 | Disposition: A | Discharge status: Home / Self Care | Payer: Medicare HMO | Source: Ambulatory Visit | Attending: Orthopedic Surgery | Admitting: Orthopedic Surgery

## 2021-03-09 ENCOUNTER — Encounter (HOSPITAL_COMMUNITY): Payer: Self-pay

## 2021-03-09 VITALS — BP 163/67 | HR 75 | Temp 98.0°F | Resp 16 | Ht 60.0 in | Wt 136.0 lb

## 2021-03-09 DIAGNOSIS — Z01818 Encounter for other preprocedural examination: Secondary | ICD-10-CM | POA: Insufficient documentation

## 2021-03-09 DIAGNOSIS — I10 Essential (primary) hypertension: Secondary | ICD-10-CM | POA: Diagnosis not present

## 2021-03-09 LAB — SURGICAL PCR SCREEN
MRSA, PCR: NEGATIVE
Staphylococcus aureus: NEGATIVE

## 2021-03-09 LAB — BASIC METABOLIC PANEL
Anion gap: 8 (ref 5–15)
BUN: 21 mg/dL (ref 8–23)
CO2: 27 mmol/L (ref 22–32)
Calcium: 9.4 mg/dL (ref 8.9–10.3)
Chloride: 104 mmol/L (ref 98–111)
Creatinine, Ser: 0.84 mg/dL (ref 0.44–1.00)
GFR, Estimated: >60 mL/min (ref >60)
Glucose, Bld: 116 mg/dL — ABNORMAL HIGH (ref 70–99)
Potassium: 4 mmol/L (ref 3.5–5.1)
Sodium: 139 mmol/L (ref 135–145)

## 2021-03-09 LAB — CBC
HCT: 37.3 % (ref 36.0–46.0)
Hemoglobin: 11.7 g/dL — ABNORMAL LOW (ref 12.0–15.0)
MCH: 25.6 pg — ABNORMAL LOW (ref 26.0–34.0)
MCHC: 31.4 g/dL (ref 30.0–36.0)
MCV: 81.6 fL (ref 80.0–100.0)
Platelets: 299 10*3/uL (ref 150–400)
RBC: 4.57 MIL/uL (ref 3.87–5.11)
RDW: 14.3 % (ref 11.5–15.5)
WBC: 9.8 10*3/uL (ref 4.0–10.5)
nRBC: 0 % (ref 0.0–0.2)

## 2021-03-09 NOTE — Progress Notes (Signed)
COVID Vaccine Completed: Yes Date COVID Vaccine completed: 2021 x 2 COVID vaccine manufacturer:   Moderna    COVID Test: 03/17/21 @ 1:00 PM Bowel prep reminder: N/A  PCP - Dr. Levin Erp Cardiologist -   Chest x-ray -  EKG -  Stress Test -  ECHO -  Cardiac Cath -  Pacemaker/ICD device last checked:  Sleep Study -  CPAP -   Fasting Blood Sugar -  Checks Blood Sugar _____ times a day  Blood Thinner Instructions: Aspirin Instructions: Last Dose:  Anesthesia review: Hx: HTN,Heart murmur  Patient denies shortness of breath, fever, cough and chest pain at PAT appointment   Patient verbalized understanding of instructions that were given to them at the PAT appointment. Patient was also instructed that they will need to review over the PAT instructions again at home before surgery.

## 2021-03-15 NOTE — Care Plan (Signed)
Ortho Bundle Case Management Note  Patient Details  Name: Deanna Wright MRN: [id number] Date of Birth: 1940/04/15     Met with patient and sister in the office prior to surgery. Multiple conversations after as well. Patient will discharge to home with sister to assist. Rolling walker and CPM ordered for home. HHPT referral to [place] and OPPT set up with [facility] - OPPT Patient and MD in agreement with plan. Choice offered.                 DME Arranged:  Gilford Rile rolling, CPM DME Agency:  [employer]  HH Arranged:  PT HH Agency:  [place]  Additional Comments: Please contact me with any questions of if this plan should need to change.  Ladell Heads,  [place] [facility] Specialist  [phone] 03/15/2021, 10:08 AM

## 2021-03-17 ENCOUNTER — Other Ambulatory Visit: Payer: Self-pay

## 2021-03-17 ENCOUNTER — Encounter (HOSPITAL_COMMUNITY)
Admission: RE | Admit: 2021-03-17 | Discharge: 2021-03-17 | Disposition: A | Discharge status: Home / Self Care | Payer: Medicare HMO | Source: Ambulatory Visit | Attending: Orthopedic Surgery | Admitting: Orthopedic Surgery

## 2021-03-17 DIAGNOSIS — Z20822 Contact with and (suspected) exposure to covid-19: Secondary | ICD-10-CM | POA: Insufficient documentation

## 2021-03-17 DIAGNOSIS — Z01812 Encounter for preprocedural laboratory examination: Secondary | ICD-10-CM | POA: Diagnosis not present

## 2021-03-17 DIAGNOSIS — Z01818 Encounter for other preprocedural examination: Secondary | ICD-10-CM

## 2021-03-17 LAB — SARS CORONAVIRUS 2 (TAT 6-24 HRS): SARS Coronavirus 2: NEGATIVE

## 2021-03-21 ENCOUNTER — Observation Stay (HOSPITAL_COMMUNITY): Payer: Medicare HMO

## 2021-03-21 ENCOUNTER — Ambulatory Visit (HOSPITAL_COMMUNITY): Payer: Medicare HMO | Admitting: Physician Assistant

## 2021-03-21 ENCOUNTER — Other Ambulatory Visit: Payer: Self-pay

## 2021-03-21 ENCOUNTER — Encounter (HOSPITAL_COMMUNITY): Payer: Self-pay | Admitting: Orthopedic Surgery

## 2021-03-21 ENCOUNTER — Ambulatory Visit (HOSPITAL_BASED_OUTPATIENT_CLINIC_OR_DEPARTMENT_OTHER): Payer: Medicare HMO | Admitting: Certified Registered Nurse Anesthetist

## 2021-03-21 ENCOUNTER — Encounter (HOSPITAL_COMMUNITY)
Admission: RE | Disposition: A | Discharge status: Home Health | Payer: Self-pay | Source: Home / Self Care | Attending: Orthopedic Surgery

## 2021-03-21 ENCOUNTER — Observation Stay (HOSPITAL_COMMUNITY)
Admission: RE | Admit: 2021-03-21 | Discharge: 2021-03-22 | Disposition: A | Discharge status: Home Health | Payer: Medicare HMO | Attending: Orthopedic Surgery | Admitting: Orthopedic Surgery

## 2021-03-21 DIAGNOSIS — Z79899 Other long term (current) drug therapy: Secondary | ICD-10-CM | POA: Insufficient documentation

## 2021-03-21 DIAGNOSIS — Z96652 Presence of left artificial knee joint: Secondary | ICD-10-CM

## 2021-03-21 DIAGNOSIS — M1712 Unilateral primary osteoarthritis, left knee: Principal | ICD-10-CM | POA: Insufficient documentation

## 2021-03-21 DIAGNOSIS — Z7982 Long term (current) use of aspirin: Secondary | ICD-10-CM | POA: Diagnosis not present

## 2021-03-21 DIAGNOSIS — Z96641 Presence of right artificial hip joint: Secondary | ICD-10-CM | POA: Diagnosis not present

## 2021-03-21 DIAGNOSIS — I10 Essential (primary) hypertension: Secondary | ICD-10-CM | POA: Diagnosis not present

## 2021-03-21 DIAGNOSIS — Z96642 Presence of left artificial hip joint: Secondary | ICD-10-CM | POA: Diagnosis not present

## 2021-03-21 DIAGNOSIS — Z96651 Presence of right artificial knee joint: Secondary | ICD-10-CM | POA: Insufficient documentation

## 2021-03-21 DIAGNOSIS — G8918 Other acute postprocedural pain: Secondary | ICD-10-CM | POA: Diagnosis not present

## 2021-03-21 DIAGNOSIS — J45909 Unspecified asthma, uncomplicated: Secondary | ICD-10-CM | POA: Insufficient documentation

## 2021-03-21 DIAGNOSIS — Z471 Aftercare following joint replacement surgery: Secondary | ICD-10-CM | POA: Diagnosis not present

## 2021-03-21 HISTORY — PX: TOTAL KNEE ARTHROPLASTY: SHX125

## 2021-03-21 SURGERY — ARTHROPLASTY, KNEE, TOTAL
Anesthesia: Regional | Site: Knee | Laterality: Left

## 2021-03-21 MED ORDER — AMLODIPINE BESYLATE 10 MG PO TABS
10.0000 mg | ORAL_TABLET | ORAL | Status: DC
  Administered 2021-03-22: 10 mg via ORAL
  Filled 2021-03-21: qty 1

## 2021-03-21 MED ORDER — BISACODYL 10 MG RE SUPP: 10.0000 mg | Freq: Every day | RECTAL | Status: DC | PRN

## 2021-03-21 MED ORDER — DEXAMETHASONE SODIUM PHOSPHATE 10 MG/ML IJ SOLN
10.0000 mg | Freq: Once | INTRAMUSCULAR | Status: DC
  Filled 2021-03-21: qty 1

## 2021-03-21 MED ORDER — DEXMEDETOMIDINE (PRECEDEX) IN NS 20 MCG/5ML (4 MCG/ML) IV SYRINGE
PREFILLED_SYRINGE | INTRAVENOUS | Status: AC
  Filled 2021-03-21: qty 5

## 2021-03-21 MED ORDER — CEFAZOLIN SODIUM-DEXTROSE 2-4 GM/100ML-% IV SOLN
2.0000 g | INTRAVENOUS | Status: AC
  Administered 2021-03-21: 2 g via INTRAVENOUS
  Filled 2021-03-21: qty 100

## 2021-03-21 MED ORDER — DEXMEDETOMIDINE (PRECEDEX) IN NS 20 MCG/5ML (4 MCG/ML) IV SYRINGE
PREFILLED_SYRINGE | INTRAVENOUS | Status: DC | PRN
Start: 2021-03-21 — End: 2021-03-21
  Administered 2021-03-21: 8 ug via INTRAVENOUS

## 2021-03-21 MED ORDER — PROPOFOL 10 MG/ML IV BOLUS
INTRAVENOUS | Status: DC | PRN
  Administered 2021-03-21: 20 mg via INTRAVENOUS
  Administered 2021-03-21: 30 mg via INTRAVENOUS

## 2021-03-21 MED ORDER — DOCUSATE SODIUM 100 MG PO CAPS
100.0000 mg | ORAL_CAPSULE | Freq: Two times a day (BID) | ORAL | Status: DC
  Administered 2021-03-21 – 2021-03-22 (×2): 100 mg via ORAL
  Filled 2021-03-21 (×2): qty 1

## 2021-03-21 MED ORDER — TRANEXAMIC ACID-NACL 1000-0.7 MG/100ML-% IV SOLN
1000.0000 mg | Freq: Once | INTRAVENOUS | Status: AC
  Administered 2021-03-21: 1000 mg via INTRAVENOUS
  Filled 2021-03-21: qty 100

## 2021-03-21 MED ORDER — SODIUM CHLORIDE 0.9 % IR SOLN
Status: DC | PRN
  Administered 2021-03-21: 1000 mL

## 2021-03-21 MED ORDER — POVIDONE-IODINE 10 % EX SWAB: 2.0000 "application " | Freq: Once | CUTANEOUS | Status: DC

## 2021-03-21 MED ORDER — METOPROLOL SUCCINATE ER 50 MG PO TB24
100.0000 mg | ORAL_TABLET | ORAL | Status: DC
  Administered 2021-03-22: 100 mg via ORAL
  Filled 2021-03-21: qty 2

## 2021-03-21 MED ORDER — METOCLOPRAMIDE HCL 5 MG/ML IJ SOLN: 5.0000 mg | Freq: Three times a day (TID) | INTRAMUSCULAR | Status: DC | PRN

## 2021-03-21 MED ORDER — LACTATED RINGERS IV SOLN
INTRAVENOUS | Status: DC
Start: 2021-03-21 — End: 2021-03-21

## 2021-03-21 MED ORDER — BUPIVACAINE-EPINEPHRINE (PF) 0.5% -1:200000 IJ SOLN
INTRAMUSCULAR | Status: DC | PRN
  Administered 2021-03-21: 30 mL via PERINEURAL

## 2021-03-21 MED ORDER — PROPOFOL 10 MG/ML IV BOLUS
INTRAVENOUS | Status: AC
  Filled 2021-03-21: qty 20

## 2021-03-21 MED ORDER — CHLORHEXIDINE GLUCONATE 0.12 % MT SOLN
15.0000 mL | Freq: Once | OROMUCOSAL | Status: AC
  Administered 2021-03-21: 15 mL via OROMUCOSAL

## 2021-03-21 MED ORDER — MIDAZOLAM HCL 2 MG/2ML IJ SOLN
1.0000 mg | Freq: Once | INTRAMUSCULAR | Status: AC
  Administered 2021-03-21: 2 mg via INTRAVENOUS
  Filled 2021-03-21: qty 2

## 2021-03-21 MED ORDER — ACETAMINOPHEN 500 MG PO TABS
1000.0000 mg | ORAL_TABLET | Freq: Once | ORAL | Status: AC
  Administered 2021-03-21: 1000 mg via ORAL
  Filled 2021-03-21: qty 2

## 2021-03-21 MED ORDER — FENTANYL CITRATE PF 50 MCG/ML IJ SOSY: 25.0000 ug | PREFILLED_SYRINGE | INTRAMUSCULAR | Status: DC | PRN

## 2021-03-21 MED ORDER — POLYETHYLENE GLYCOL 3350 17 G PO PACK: 17.0000 g | PACK | Freq: Every day | ORAL | Status: DC | PRN

## 2021-03-21 MED ORDER — ASPIRIN 81 MG PO CHEW
81.0000 mg | CHEWABLE_TABLET | Freq: Two times a day (BID) | ORAL | Status: DC
  Administered 2021-03-22: 81 mg via ORAL
  Filled 2021-03-21: qty 1

## 2021-03-21 MED ORDER — ONDANSETRON HCL 4 MG/2ML IJ SOLN: 4.0000 mg | Freq: Once | INTRAMUSCULAR | Status: DC | PRN

## 2021-03-21 MED ORDER — METOPROLOL SUCCINATE ER 100 MG PO TB24
100.0000 mg | ORAL_TABLET | Freq: Once | ORAL | Status: AC
  Administered 2021-03-21: 100 mg via ORAL
  Filled 2021-03-21: qty 1

## 2021-03-21 MED ORDER — TRANEXAMIC ACID-NACL 1000-0.7 MG/100ML-% IV SOLN
1000.0000 mg | INTRAVENOUS | Status: AC
  Administered 2021-03-21: 1000 mg via INTRAVENOUS
  Filled 2021-03-21: qty 100

## 2021-03-21 MED ORDER — DIPHENHYDRAMINE HCL 12.5 MG/5ML PO ELIX: 12.5000 mg | ORAL_SOLUTION | ORAL | Status: DC | PRN

## 2021-03-21 MED ORDER — ALUM & MAG HYDROXIDE-SIMETH 200-200-20 MG/5ML PO SUSP: 30.0000 mL | ORAL | Status: DC | PRN

## 2021-03-21 MED ORDER — METOCLOPRAMIDE HCL 5 MG PO TABS
5.0000 mg | ORAL_TABLET | Freq: Three times a day (TID) | ORAL | Status: DC | PRN
  Filled 2021-03-21: qty 2

## 2021-03-21 MED ORDER — MENTHOL 3 MG MT LOZG: 1.0000 | LOZENGE | OROMUCOSAL | Status: DC | PRN

## 2021-03-21 MED ORDER — CEFAZOLIN SODIUM-DEXTROSE 1-4 GM/50ML-% IV SOLN
1.0000 g | Freq: Four times a day (QID) | INTRAVENOUS | Status: AC
  Administered 2021-03-21 (×2): 1 g via INTRAVENOUS
  Filled 2021-03-21 (×2): qty 50

## 2021-03-21 MED ORDER — SODIUM CHLORIDE BACTERIOSTATIC 0.9 % IJ SOLN
INTRAMUSCULAR | Status: DC | PRN
  Administered 2021-03-21: 30 mL via INTRAMUSCULAR

## 2021-03-21 MED ORDER — ONDANSETRON HCL 4 MG PO TABS
4.0000 mg | ORAL_TABLET | Freq: Four times a day (QID) | ORAL | Status: DC | PRN
  Filled 2021-03-21: qty 1

## 2021-03-21 MED ORDER — SODIUM CHLORIDE (PF) 0.9 % IJ SOLN
INTRAMUSCULAR | Status: AC
  Filled 2021-03-21: qty 30

## 2021-03-21 MED ORDER — PANTOPRAZOLE SODIUM 40 MG PO TBEC
40.0000 mg | DELAYED_RELEASE_TABLET | Freq: Every day | ORAL | Status: DC
  Administered 2021-03-21 – 2021-03-22 (×2): 40 mg via ORAL
  Filled 2021-03-21 (×2): qty 1

## 2021-03-21 MED ORDER — ONDANSETRON HCL 4 MG/2ML IJ SOLN: 4.0000 mg | Freq: Four times a day (QID) | INTRAMUSCULAR | Status: DC | PRN

## 2021-03-21 MED ORDER — TRAMADOL HCL 50 MG PO TABS
ORAL_TABLET | ORAL | Status: AC
  Filled 2021-03-21: qty 1

## 2021-03-21 MED ORDER — MORPHINE SULFATE (PF) 2 MG/ML IV SOLN
0.5000 mg | INTRAVENOUS | Status: DC | PRN
  Filled 2021-03-21: qty 1

## 2021-03-21 MED ORDER — PROPOFOL 500 MG/50ML IV EMUL
INTRAVENOUS | Status: DC | PRN
  Administered 2021-03-21: 75 ug/kg/min via INTRAVENOUS

## 2021-03-21 MED ORDER — ACETAMINOPHEN 325 MG PO TABS: 325.0000 mg | ORAL_TABLET | Freq: Four times a day (QID) | ORAL | Status: DC | PRN

## 2021-03-21 MED ORDER — PHENYLEPHRINE 40 MCG/ML (10ML) SYRINGE FOR IV PUSH (FOR BLOOD PRESSURE SUPPORT)
PREFILLED_SYRINGE | INTRAVENOUS | Status: AC
  Filled 2021-03-21: qty 10

## 2021-03-21 MED ORDER — FENTANYL CITRATE PF 50 MCG/ML IJ SOSY
50.0000 ug | PREFILLED_SYRINGE | Freq: Once | INTRAMUSCULAR | Status: DC
  Filled 2021-03-21: qty 2

## 2021-03-21 MED ORDER — AMISULPRIDE (ANTIEMETIC) 5 MG/2ML IV SOLN: 10.0000 mg | Freq: Once | INTRAVENOUS | Status: DC | PRN

## 2021-03-21 MED ORDER — EPHEDRINE 5 MG/ML INJ
INTRAVENOUS | Status: AC
  Filled 2021-03-21: qty 5

## 2021-03-21 MED ORDER — ORAL CARE MOUTH RINSE: 15.0000 mL | Freq: Once | OROMUCOSAL | Status: AC

## 2021-03-21 MED ORDER — HYDROCODONE-ACETAMINOPHEN 5-325 MG PO TABS
1.0000 | ORAL_TABLET | ORAL | Status: DC | PRN
  Administered 2021-03-21: 1 via ORAL
  Filled 2021-03-21: qty 1

## 2021-03-21 MED ORDER — ONDANSETRON HCL 4 MG/2ML IJ SOLN
INTRAMUSCULAR | Status: DC | PRN
  Administered 2021-03-21: 4 mg via INTRAVENOUS

## 2021-03-21 MED ORDER — BUPIVACAINE-EPINEPHRINE (PF) 0.25% -1:200000 IJ SOLN
INTRAMUSCULAR | Status: AC
  Filled 2021-03-21: qty 30

## 2021-03-21 MED ORDER — BUPIVACAINE LIPOSOME 1.3 % IJ SUSP: 20.0000 mL | Freq: Once | INTRAMUSCULAR | Status: DC

## 2021-03-21 MED ORDER — PROPOFOL 1000 MG/100ML IV EMUL
INTRAVENOUS | Status: AC
  Filled 2021-03-21: qty 100

## 2021-03-21 MED ORDER — EPHEDRINE SULFATE-NACL 50-0.9 MG/10ML-% IV SOSY
PREFILLED_SYRINGE | INTRAVENOUS | Status: DC | PRN
  Administered 2021-03-21: 10 mg via INTRAVENOUS

## 2021-03-21 MED ORDER — ACETAMINOPHEN 500 MG PO TABS
500.0000 mg | ORAL_TABLET | Freq: Four times a day (QID) | ORAL | Status: DC
  Filled 2021-03-21: qty 1

## 2021-03-21 MED ORDER — BUPIVACAINE LIPOSOME 1.3 % IJ SUSP
INTRAMUSCULAR | Status: DC | PRN
  Administered 2021-03-21: 20 mL

## 2021-03-21 MED ORDER — DEXAMETHASONE SODIUM PHOSPHATE 10 MG/ML IJ SOLN
8.0000 mg | Freq: Once | INTRAMUSCULAR | Status: AC
  Administered 2021-03-21: 8 mg via INTRAVENOUS

## 2021-03-21 MED ORDER — PHENOL 1.4 % MT LIQD: 1.0000 | OROMUCOSAL | Status: DC | PRN

## 2021-03-21 MED ORDER — BUPIVACAINE LIPOSOME 1.3 % IJ SUSP
INTRAMUSCULAR | Status: AC
  Filled 2021-03-21: qty 20

## 2021-03-21 MED ORDER — POVIDONE-IODINE 10 % EX SWAB
2.0000 "application " | Freq: Once | CUTANEOUS | Status: AC
  Administered 2021-03-21: 2 via TOPICAL

## 2021-03-21 MED ORDER — WATER FOR IRRIGATION, STERILE IR SOLN
Status: DC | PRN
  Administered 2021-03-21: 2000 mL

## 2021-03-21 MED ORDER — HYDROCODONE-ACETAMINOPHEN 7.5-325 MG PO TABS
1.0000 | ORAL_TABLET | ORAL | Status: DC | PRN
  Administered 2021-03-21: 2 via ORAL
  Administered 2021-03-21: 1 via ORAL
  Administered 2021-03-22 (×2): 2 via ORAL
  Filled 2021-03-21 (×2): qty 2
  Filled 2021-03-21: qty 1
  Filled 2021-03-21: qty 2

## 2021-03-21 MED ORDER — BUPIVACAINE IN DEXTROSE 0.75-8.25 % IT SOLN
INTRATHECAL | Status: DC | PRN
  Administered 2021-03-21: 1.6 mL via INTRATHECAL

## 2021-03-21 MED ORDER — VITAMIN D3 25 MCG (1000 UNIT) PO TABS
1000.0000 [IU] | ORAL_TABLET | Freq: Every day | ORAL | Status: DC
  Administered 2021-03-22: 1000 [IU] via ORAL
  Filled 2021-03-21 (×2): qty 1

## 2021-03-21 MED ORDER — TRAMADOL HCL 50 MG PO TABS
50.0000 mg | ORAL_TABLET | Freq: Four times a day (QID) | ORAL | Status: DC
  Administered 2021-03-21 – 2021-03-22 (×2): 50 mg via ORAL
  Filled 2021-03-21 (×2): qty 1

## 2021-03-21 MED ORDER — BUPIVACAINE-EPINEPHRINE 0.25% -1:200000 IJ SOLN
INTRAMUSCULAR | Status: DC | PRN
  Administered 2021-03-21: 30 mL

## 2021-03-21 MED ORDER — 0.9 % SODIUM CHLORIDE (POUR BTL) OPTIME
TOPICAL | Status: DC | PRN
  Administered 2021-03-21: 1000 mL

## 2021-03-21 SURGICAL SUPPLY — 49 items
BAG COUNTER SPONGE SURGICOUNT (BAG) IMPLANT
BLADE HEX COATED 2.75 (ELECTRODE) ×2 IMPLANT
BLADE SAG 18X100X1.27 (BLADE) ×2 IMPLANT
BLADE SAGITTAL 25.0X1.37X90 (BLADE) ×2 IMPLANT
BLADE SURG 15 STRL LF DISP TIS (BLADE) ×1 IMPLANT
BLADE SURG 15 STRL SS (BLADE) ×2
BLADE SURG SZ10 CARB STEEL (BLADE) ×4 IMPLANT
BNDG ELASTIC 6X10 VLCR STRL LF (GAUZE/BANDAGES/DRESSINGS) ×2 IMPLANT
BOWL SMART MIX CTS (DISPOSABLE) IMPLANT
CLSR STERI-STRIP ANTIMIC 1/2X4 (GAUZE/BANDAGES/DRESSINGS) ×2 IMPLANT
COMP FEMORAL CEMNTLESS SZ2 TRI (Joint) ×2 IMPLANT
COMPONENT FEMRL CMNTLS SZ2 TRI (Joint) IMPLANT
COVER SURGICAL LIGHT HANDLE (MISCELLANEOUS) ×2 IMPLANT
CUFF TOURN SGL QUICK 34 (TOURNIQUET CUFF) ×2
CUFF TRNQT CYL 34X4.125X (TOURNIQUET CUFF) ×1 IMPLANT
DRAPE INCISE IOBAN 66X45 STRL (DRAPES) ×2 IMPLANT
DRAPE U-SHAPE 47X51 STRL (DRAPES) ×2 IMPLANT
DRSG MEPILEX BORDER 4X12 (GAUZE/BANDAGES/DRESSINGS) ×2 IMPLANT
DURAPREP 26ML APPLICATOR (WOUND CARE) ×4 IMPLANT
GLOVE SRG 8 PF TXTR STRL LF DI (GLOVE) ×1 IMPLANT
GLOVE SURG ENC MOIS LTX SZ7.5 (GLOVE) ×2 IMPLANT
GLOVE SURG POLYISO LF SZ7.5 (GLOVE) ×2 IMPLANT
GLOVE SURG UNDER POLY LF SZ7.5 (GLOVE) ×2 IMPLANT
GLOVE SURG UNDER POLY LF SZ8 (GLOVE) ×2
GOWN STRL REUS W/TWL LRG LVL3 (GOWN DISPOSABLE) ×2 IMPLANT
GOWN STRL REUS W/TWL XL LVL3 (GOWN DISPOSABLE) ×2 IMPLANT
HANDPIECE INTERPULSE COAX TIP (DISPOSABLE) ×2
HOLDER FOLEY CATH W/STRAP (MISCELLANEOUS) IMPLANT
IMMOBILIZER KNEE 22 UNIV (SOFTGOODS) ×2 IMPLANT
INSERT TIB BEARING SZ3 11 (Miscellaneous) ×1 IMPLANT
KNEE PATELLA ASYMMETRIC 9X29 (Knees) ×1 IMPLANT
KNEE TIBIAL COMPONENT SZ3 (Knees) ×1 IMPLANT
MANIFOLD NEPTUNE II (INSTRUMENTS) ×2 IMPLANT
NS IRRIG 1000ML POUR BTL (IV SOLUTION) ×2 IMPLANT
PACK TOTAL KNEE CUSTOM (KITS) ×2 IMPLANT
PIN FLUTED HEDLESS FIX 3.5X1/8 (PIN) ×1 IMPLANT
PROTECTOR NERVE ULNAR (MISCELLANEOUS) ×2 IMPLANT
SET HNDPC FAN SPRY TIP SCT (DISPOSABLE) ×1 IMPLANT
SPIKE FLUID TRANSFER (MISCELLANEOUS) ×2 IMPLANT
SPONGE T-LAP 18X18 ~~LOC~~+RFID (SPONGE) ×6 IMPLANT
SUT MNCRL AB 3-0 PS2 18 (SUTURE) ×2 IMPLANT
SUT VIC AB 0 CT1 36 (SUTURE) ×2 IMPLANT
SUT VIC AB 1 CT1 36 (SUTURE) ×4 IMPLANT
SUT VIC AB 2-0 CT1 27 (SUTURE) ×2
SUT VIC AB 2-0 CT1 TAPERPNT 27 (SUTURE) ×1 IMPLANT
TRAY FOLEY MTR SLVR 14FR STAT (SET/KITS/TRAYS/PACK) IMPLANT
TRAY FOLEY MTR SLVR 16FR STAT (SET/KITS/TRAYS/PACK) IMPLANT
TUBE SUCTION HIGH CAP CLEAR NV (SUCTIONS) ×2 IMPLANT
WRAP KNEE MAXI GEL POST OP (GAUZE/BANDAGES/DRESSINGS) ×2 IMPLANT

## 2021-03-21 NOTE — Anesthesia Procedure Notes (Signed)
Anesthesia Regional Block: Adductor canal block   Pre-Anesthetic Checklist: , timeout performed,  Correct Patient, Correct Site, Correct Laterality,  Correct Procedure,, site marked,  Risks and benefits discussed,  Surgical consent,  Pre-op evaluation,  At surgeon's request and post-op pain management  Laterality: Left  Prep: chloraprep       Needles:  Injection technique: Single-shot  Needle Type: Echogenic Stimulator Needle     Needle Length: 9cm  Needle Gauge: 21     Additional Needles:   Procedures:,,,, ultrasound used (permanent image in chart),,    Narrative:  Start time: 03/21/2021 9:00 AM End time: 03/21/2021 9:10 AM Injection made incrementally with aspirations every 5 mL.  Performed by: Personally  Anesthesiologist: Murvin Natal, MD  Additional Notes: Functioning IV was confirmed and monitors were applied. A time-out was performed. Hand hygiene and sterile gloves were used. The thigh was placed in a frog-leg position and prepped in a sterile fashion. A 4mm 21ga Arrow echogenic stimulator needle was placed using ultrasound guidance.  Negative aspiration and negative test dose prior to incremental administration of local anesthetic. The patient tolerated the procedure well.

## 2021-03-21 NOTE — Op Note (Signed)
DATE OF SURGERY:  03/21/2021 TIME: 11:17 AM  PATIENT NAME:  Deanna Wright   AGE: 81 y.o.    PRE-OPERATIVE DIAGNOSIS:  OA LEFT KNEE  POST-OPERATIVE DIAGNOSIS:  Same  PROCEDURE:  Procedure(s): TOTAL KNEE ARTHROPLASTY   SURGEON:  Renette Butters, MD   ASSISTANT:  Aggie Moats, PA-C, he was present and scrubbed throughout the case, critical for completion in a timely fashion, and for retraction, instrumentation, and closure.    OPERATIVE IMPLANTS: Stryker Triathlon CR. Press fit knee  Femur size 2, Tibia size 3, Patella size 29 3-peg oval button, with a 11 mm polyethylene insert.   PREOPERATIVE INDICATIONS:  Deanna Wright is a 81 y.o. year old female with end stage bone on bone degenerative arthritis of the knee who failed conservative treatment, including injections, antiinflammatories, activity modification, and assistive devices, and had significant impairment of their activities of daily living, and elected for Total Knee Arthroplasty.   The risks, benefits, and alternatives were discussed at length including but not limited to the risks of infection, bleeding, nerve injury, stiffness, blood clots, the need for revision surgery, cardiopulmonary complications, among others, and they were willing to proceed.   OPERATIVE DESCRIPTION:  The patient was brought to the operative room and placed in a supine position.  General anesthesia was administered.  IV antibiotics were given.  The lower extremity was prepped and draped in the usual sterile fashion.  Time out was performed.  The leg was elevated and exsanguinated and the tourniquet was inflated.  Anterior approach was performed.  The patella was everted and osteophytes were removed.  The anterior horn of the medial and lateral meniscus was removed.   The distal femur was opened with the drill and the intramedullary distal femoral cutting jig was utilized, set at 5 degrees resecting 8 mm off the distal femur.  Care was  taken to protect the collateral ligaments.  The distal femoral sizing jig was applied, taking care to avoid notching.  Then the 4-in-1 cutting jig was applied and the anterior and posterior femur was cut, along with the chamfer cuts.  All posterior osteophytes were removed.  The flexion gap was then measured and was symmetric with the extension gap.  Then the extramedullary tibial cutting jig was utilized making the appropriate cut using the anterior tibial crest as a reference building in appropriate posterior slope.  Care was taken during the cut to protect the medial and collateral ligaments.  The proximal tibia was removed along with the posterior horns of the menisci.  The PCL was sacrificed.    The extensor gap was measured and was approximately 19mm.    I completed the distal femoral preparation using the appropriate jig to prepare the box.  The patella was then measured, and cut with the saw.    The proximal tibia sized and prepared accordingly with the reamer and the punch, and then all components were trialed with the above sized poly insert.  The knee was found to have excellent balance and full motion.    The above named components were then impacted into place and Poly tibial piece and patella were inserted.  I was very happy with his stability and ROM  I performed a periarticular injection with marcaine and toradol  The knee was easily taken through a range of motion and the patella tracked well and the knee irrigated copiously and the parapatellar and subcutaneous tissue closed with vicryl, and monocryl with steri strips for the skin.  The  incision was dressed with sterile gauze and the tourniquet released and the patient was awakened and returned to the PACU in stable and satisfactory condition.  There were no complications.  Total tourniquet time was roughly 60 minutes.   POSTOPERATIVE PLAN: post op Abx, DVT px: SCD's, TED's, Early ambulation and chemical px

## 2021-03-21 NOTE — Anesthesia Postprocedure Evaluation (Signed)
Anesthesia Post Note  Patient: ANSHI JALLOH  Procedure(s) Performed: TOTAL KNEE ARTHROPLASTY (Left: Knee)     Patient location during evaluation: PACU Anesthesia Type: Regional and MAC Level of consciousness: awake and alert Pain management: pain level controlled Vital Signs Assessment: post-procedure vital signs reviewed and stable Respiratory status: spontaneous breathing and respiratory function stable Cardiovascular status: blood pressure returned to baseline and stable Postop Assessment: spinal receding Anesthetic complications: no   No notable events documented.  Last Vitals:  Vitals:   03/21/21 1202 03/21/21 1230  BP:  (!) 123/92  Pulse:  67  Resp:  15  Temp: 36.5 C   SpO2:  98%    Last Pain:  Vitals:   03/21/21 1230  TempSrc:   PainSc: 0-No pain                 Avelino Herren DANIEL

## 2021-03-21 NOTE — Interval H&P Note (Signed)
History and Physical Interval Note:  03/21/2021 7:26 AM  Deanna Wright  has presented today for surgery, with the diagnosis of OA LEFT KNEE.  The various methods of treatment have been discussed with the patient and family. After consideration of risks, benefits and other options for treatment, the patient has consented to  Procedure(s): TOTAL KNEE ARTHROPLASTY (Left) as a surgical intervention.  The patient's history has been reviewed, patient examined, no change in status, stable for surgery.  I have reviewed the patient's chart and labs.  Questions were answered to the patient's satisfaction.     Renette Butters

## 2021-03-21 NOTE — Anesthesia Procedure Notes (Signed)
Spinal  Patient location during procedure: OR Start time: 03/21/2021 10:10 AM End time: 03/21/2021 10:20 AM Reason for block: surgical anesthesia Staffing Performed: anesthesiologist  Anesthesiologist: Murvin Natal, MD Preanesthetic Checklist Completed: patient identified, IV checked, risks and benefits discussed, surgical consent, monitors and equipment checked, pre-op evaluation and timeout performed Spinal Block Patient position: sitting Prep: DuraPrep Patient monitoring: cardiac monitor, continuous pulse ox and blood pressure Approach: midline Location: L4-5 Injection technique: single-shot Needle Needle type: [place]  Needle gauge: 22 G Needle length: 9 cm Assessment Sensory level: T10 Events: CSF return and second provider Additional Notes Functioning IV was confirmed and monitors were applied. Sterile prep and drape, including hand hygiene and sterile gloves were used. The patient was positioned and the spine was prepped. The skin was anesthetized with lidocaine.  Free flow of clear CSF was obtained after multiple attempts prior to injecting local anesthetic into the CSF.  The spinal needle aspirated freely following injection.  The needle was carefully withdrawn.  The patient tolerated the procedure well.

## 2021-03-21 NOTE — Evaluation (Signed)
Physical Therapy Evaluation Patient Details Name: Deanna Wright MRN: [id number] DOB: 1940/05/26 Today's Date: 03/21/2021  History of Present Illness  Pt is 81 yo female s/p L TKA on 03/21/21.  Pt with hx including but not limited to DJD, OA, HTN, scoliosis, R THA in 2021.  Clinical Impression  Pt is s/p TKA resulting in the deficits listed below (see PT Problem List). At baseline, pt lives alone and is independent.  She has necessary DME. Pt will also have support from sister and nephews at d/c.  Today, pt with increased pain in bed and was premedicated.  Pt had decreased pain with mobility and when repositioned in recliner.  She ambulated 74' with min guard and min A for transfers with cues.  She had excellent quad activation with 0 degrees extension; Able to flex to 70 degrees limited by pain. Pt expected to progress well with therapy.  Pt will benefit from skilled PT to increase their independence and safety with mobility to allow discharge to the venue listed below.         Recommendations for follow up therapy are one component of a multi-disciplinary discharge planning process, led by the attending physician.  Recommendations may be updated based on patient status, additional functional criteria and insurance authorization.  Follow Up Recommendations Follow physician's recommendations for discharge plan and follow up therapies    Assistance Recommended at Discharge Frequent or constant Supervision/Assistance  Patient can return home with the following  A little help with walking and/or transfers;A little help with bathing/dressing/bathroom;Assistance with cooking/housework;Help with stairs or ramp for entrance    Equipment Recommendations None recommended by PT  Recommendations for Other Services       Functional Status Assessment Patient has had a recent decline in their functional status and demonstrates the ability to make significant improvements in function in a reasonable  and predictable amount of time.     Precautions / Restrictions Precautions Precautions: Fall Required Braces or Orthoses: Knee Immobilizer - Left Knee Immobilizer - Left: Discontinue once straight leg raise with < 10 degree lag Restrictions Weight Bearing Restrictions: Yes LLE Weight Bearing: Weight bearing as tolerated      Mobility  Bed Mobility Overal bed mobility: Needs Assistance Bed Mobility: Supine to Sit     Supine to sit: Min assist     General bed mobility comments: Min A for L LE    Transfers Overall transfer level: Needs assistance Equipment used: Rolling walker (2 wheels) Transfers: Sit to/from Stand Sit to Stand: Min guard           General transfer comment: Cues for hand placement and L LE management    Ambulation/Gait Ambulation/Gait assistance: Min guard Gait Distance (Feet): 40 Feet Assistive device: Rolling walker (2 wheels) Gait Pattern/deviations: Step-to pattern, Decreased stride length Gait velocity: decreased     General Gait Details: Min cues for sequencing and RW proxmity  Stairs            Wheelchair Mobility    Modified Rankin (Stroke Patients Only)       Balance Overall balance assessment: Needs assistance Sitting-balance support: No upper extremity supported Sitting balance-Leahy Scale: Normal     Standing balance support: Bilateral upper extremity supported, Reliant on assistive device for balance Standing balance-Leahy Scale: Poor                               Pertinent Vitals/Pain Pain Assessment Pain Assessment: 0-10  Pain Score: 7  Pain Location: L knee Pain Descriptors / Indicators: Discomfort Pain Intervention(s): Limited activity within patient's tolerance, Monitored during session, Premedicated before session, Ice applied, Repositioned (Pain to 1/10 after therapy)    Home Living Family/patient expects to be discharged to:: Private residence Living Arrangements: Alone Available Help at  Discharge: Family (sister staying during day and nephew at night) Type of Home: House Home Access: Stairs to enter Entrance Stairs-Rails: None Entrance Stairs-Number of Steps: 1   Home Layout: One level Home Equipment: International aid/development worker (2 wheels);Cane - single point      Prior Function Prior Level of Function : Independent/Modified Independent;Driving             Mobility Comments: Can ambulate in community without AD but was getting limited due to knee pain ADLs Comments: Does ADLs, IADLs     Hand Dominance        Extremity/Trunk Assessment   Upper Extremity Assessment Upper Extremity Assessment: Overall WFL for tasks assessed (Some mild limitations L shoulder elevation)    Lower Extremity Assessment Lower Extremity Assessment: LLE deficits/detail;RLE deficits/detail RLE Deficits / Details: ROM WFL; MMT 5/5 RLE Sensation: WNL LLE Deficits / Details: ROM: ankle and hip WFL, knee 0 to 70 degrees; MMT: ankle 5/5,knee and hip 3/5 LLE Sensation: WNL    Cervical / Trunk Assessment Cervical / Trunk Assessment: Normal  Communication   Communication: No difficulties  Cognition Arousal/Alertness: Awake/alert Behavior During Therapy: WFL for tasks assessed/performed Overall Cognitive Status: Within Functional Limits for tasks assessed                                 General Comments: Needs redirecting at times        General Comments      Exercises     Assessment/Plan    PT Assessment Patient needs continued PT services  PT Problem List Decreased strength;Decreased mobility;Decreased safety awareness;Decreased range of motion;Decreased activity tolerance;Decreased balance;Decreased knowledge of use of DME;Pain       PT Treatment Interventions DME instruction;Therapeutic activities;Modalities;Gait training;Therapeutic exercise;Patient/family education;Stair training;Functional mobility training;Balance training    PT Goals  (Current goals can be found in the Care Plan section)  Acute Rehab PT Goals Patient Stated Goal: return home PT Goal Formulation: With patient Time For Goal Achievement: 04/04/21 Potential to Achieve Goals: Good    Frequency 7X/week     Co-evaluation               AM-PAC PT "6 Clicks" Mobility  Outcome Measure Help needed turning from your back to your side while in a flat bed without using bedrails?: A Little Help needed moving from lying on your back to sitting on the side of a flat bed without using bedrails?: A Little Help needed moving to and from a bed to a chair (including a wheelchair)?: A Little Help needed standing up from a chair using your arms (e.g., wheelchair or bedside chair)?: A Little Help needed to walk in hospital room?: A Little Help needed climbing 3-5 steps with a railing? : A Little 6 Click Score: 18    End of Session Equipment Utilized During Treatment: Gait belt Activity Tolerance: Patient tolerated treatment well Patient left: with chair alarm set;in chair;with call bell/phone within reach Nurse Communication: Mobility status PT Visit Diagnosis: Other abnormalities of gait and mobility (R26.89);Muscle weakness (generalized) (M62.81)    Time: 8916-9450 PT Time Calculation (min) (ACUTE ONLY): 30 min  Charges:   PT Evaluation $PT Eval Low Complexity: 1 Low PT Treatments $Gait Training: 8-22 mins        Abran Richard, PT Acute Rehab Services Pager [phone] [facility] [place] 03/21/2021, 6:41 PM

## 2021-03-21 NOTE — Progress Notes (Signed)
Orthopedic Tech Progress Note Patient Details:  Deanna Wright April 30, 1940 [id number]  Patient ID: Deanna Wright, female   DOB: 05/31/1940, 81 y.o.   MRN: [id number]  [name] 03/21/2021, 12:39 PM Cpm placed on patient in pacu. Bone foam placed on bed in pacu

## 2021-03-21 NOTE — Progress Notes (Signed)
Assisted Dr. Ellender with left, ultrasound guided, adductor canal block. Side rails up, monitors on throughout procedure. See vital signs in flow sheet. Tolerated Procedure well.  

## 2021-03-21 NOTE — Addendum Note (Signed)
Addendum  created 03/21/21 2115 by Murvin Natal, MD   Child order released for a procedure order, Clinical Note Signed, Intraprocedure Blocks edited, SmartForm saved

## 2021-03-21 NOTE — Transfer of Care (Signed)
Immediate Anesthesia Transfer of Care Note  Patient: Deanna Wright  Procedure(s) Performed: TOTAL KNEE ARTHROPLASTY (Left: Knee)  Patient Location: PACU  Anesthesia Type:Spinal and MAC combined with regional for post-op pain  Level of Consciousness: awake, drowsy and patient cooperative  Airway & Oxygen Therapy: Patient Spontanous Breathing and Patient connected to face mask oxygen  Post-op Assessment: Report given to RN and Post -op Vital signs reviewed and stable  Post vital signs: Reviewed and stable  Last Vitals:  Vitals Value Taken Time  BP 135/69 03/21/21 1202  Temp    Pulse 66 03/21/21 1203  Resp 14 03/21/21 1203  SpO2 100 % 03/21/21 1203  Vitals shown include unvalidated device data.  Last Pain:  Vitals:   03/21/21 0820  TempSrc:   PainSc: 0-No pain         Complications: No notable events documented.

## 2021-03-21 NOTE — Anesthesia Preprocedure Evaluation (Addendum)
Anesthesia Evaluation  Patient identified by MRN, date of birth, ID band Patient awake    Reviewed: Allergy & Precautions, NPO status , Patient's Chart, lab work & pertinent test results  Airway Mallampati: III  TM Distance: >3 FB Neck ROM: Full    Dental no notable dental hx.    Pulmonary asthma ,    Pulmonary exam normal        Cardiovascular hypertension, Pt. on medications and Pt. on home beta blockers Normal cardiovascular exam  ECG: SB, rate 58   Neuro/Psych negative neurological ROS  negative psych ROS   GI/Hepatic Neg liver ROS, PUD, GERD  Medicated and Controlled,  Endo/Other  negative endocrine ROS  Renal/GU negative Renal ROS     Musculoskeletal  (+) Arthritis ,   Abdominal   Peds  Hematology negative hematology ROS (+)   Anesthesia Other Findings OA LEFT KNEE  Reproductive/Obstetrics                            Anesthesia Physical Anesthesia Plan  ASA: 2  Anesthesia Plan: Regional and Spinal   Post-op Pain Management:    Induction: Intravenous  PONV Risk Score and Plan: 2 and Ondansetron, Dexamethasone, Propofol infusion, Midazolam and Treatment may vary due to age or medical condition  Airway Management Planned: Simple Face Mask  Additional Equipment:   Intra-op Plan:   Post-operative Plan:   Informed Consent: I have reviewed the patients History and Physical, chart, labs and discussed the procedure including the risks, benefits and alternatives for the proposed anesthesia with the patient or authorized representative who has indicated his/her understanding and acceptance.     Dental advisory given  Plan Discussed with: CRNA  Anesthesia Plan Comments:         Anesthesia Quick Evaluation

## 2021-03-21 NOTE — Plan of Care (Signed)
°  Problem: Education: Goal: Knowledge of General Education information will improve Description: Including pain rating scale, medication(s)/side effects and non-pharmacologic comfort measures Outcome: Progressing   Problem: Clinical Measurements: Goal: Ability to maintain clinical measurements within normal limits will improve Outcome: Progressing   Problem: Activity: Goal: Risk for activity intolerance will decrease Outcome: Progressing   Problem: Nutrition: Goal: Adequate nutrition will be maintained Outcome: Progressing   Problem: Elimination: Goal: Will not experience complications related to bowel motility Outcome: Progressing   Problem: Pain Managment: Goal: General experience of comfort will improve Outcome: Progressing   Problem: Education: Goal: Knowledge of the prescribed therapeutic regimen will improve Outcome: Progressing   Problem: Activity: Goal: Ability to avoid complications of mobility impairment will improve Outcome: Progressing   Problem: Pain Management: Goal: Pain level will decrease with appropriate interventions Outcome: Progressing

## 2021-03-21 NOTE — Discharge Instructions (Signed)

## 2021-03-22 ENCOUNTER — Encounter (HOSPITAL_COMMUNITY): Payer: Self-pay | Admitting: Orthopedic Surgery

## 2021-03-22 DIAGNOSIS — M1712 Unilateral primary osteoarthritis, left knee: Secondary | ICD-10-CM | POA: Diagnosis not present

## 2021-03-22 DIAGNOSIS — Z96641 Presence of right artificial hip joint: Secondary | ICD-10-CM | POA: Diagnosis not present

## 2021-03-22 DIAGNOSIS — I10 Essential (primary) hypertension: Secondary | ICD-10-CM | POA: Diagnosis not present

## 2021-03-22 DIAGNOSIS — Z96651 Presence of right artificial knee joint: Secondary | ICD-10-CM | POA: Diagnosis not present

## 2021-03-22 DIAGNOSIS — Z7982 Long term (current) use of aspirin: Secondary | ICD-10-CM | POA: Diagnosis not present

## 2021-03-22 DIAGNOSIS — J45909 Unspecified asthma, uncomplicated: Secondary | ICD-10-CM | POA: Diagnosis not present

## 2021-03-22 DIAGNOSIS — Z79899 Other long term (current) drug therapy: Secondary | ICD-10-CM | POA: Diagnosis not present

## 2021-03-22 DIAGNOSIS — Z96652 Presence of left artificial knee joint: Secondary | ICD-10-CM | POA: Diagnosis not present

## 2021-03-22 MED ORDER — ASPIRIN EC 81 MG PO TBEC: 81.0000 mg | DELAYED_RELEASE_TABLET | Freq: Two times a day (BID) | ORAL | 0 refills | Status: DC

## 2021-03-22 MED ORDER — HYDROCODONE-ACETAMINOPHEN 10-325 MG PO TABS: 1.0000 | ORAL_TABLET | Freq: Four times a day (QID) | ORAL | 0 refills | Status: DC | PRN

## 2021-03-22 MED ORDER — OXYCODONE HCL 5 MG PO TABS
5.0000 mg | ORAL_TABLET | Freq: Four times a day (QID) | ORAL | Status: DC | PRN
  Administered 2021-03-22 (×2): 10 mg via ORAL
  Filled 2021-03-22 (×2): qty 2

## 2021-03-22 MED ORDER — ONDANSETRON 4 MG PO TBDP: 4.0000 mg | ORAL_TABLET | Freq: Two times a day (BID) | ORAL | 0 refills | Status: DC | PRN

## 2021-03-22 NOTE — Discharge Summary (Signed)
Physician Discharge Summary  Patient ID: JANEAH KOVACICH MRN: [id number] DOB/AGE: Jun 26, 1940 81 y.o.  Admit date: 03/21/2021 Discharge date: 03/22/2021  Admission Diagnoses: left knee OA  Discharge Diagnoses:  Principal Problem:   S/P total knee arthroplasty, left   Discharged Condition: fair  Hospital Course: Patient underwent a left TKA by Dr. Percell Miller on 7/89/38 without complications. She spent the night in observation for pain control and mobilization improvement. She has passed her PT evaluation and is ready to go home with OPPT already set up.  Consults: None  Significant Diagnostic Studies: n/a  Treatments: IV hydration, antibiotics: Ancef, analgesia: acetaminophen, Vicodin, Dilaudid, and Morphine, anticoagulation: ASA, and surgery: left TKA  Discharge Exam: Blood pressure 131/66, pulse 67, temperature 97.9 F (36.6 C), temperature source Oral, resp. rate 16, height 5' (1.524 m), weight 61.7 kg, SpO2 97 %. General appearance: alert, cooperative, and no distress Head: Normocephalic, without obvious abnormality, atraumatic Eyes: conjunctivae/corneas clear. PERRL, EOM's intact. Fundi benign. Resp: clear to auscultation bilaterally Cardio: regular rate and rhythm, S1, S2 normal, no murmur, click, rub or gallop GI: soft, non-tender; bowel sounds normal; no masses,  no organomegaly Extremities: extremities normal, atraumatic, no cyanosis or edema Pulses:  L brachial 2+ R brachial 2+  L radial 2+ R radial 2+  L inguinal 2+ R inguinal 2+  L popliteal 2+ R popliteal 2+  L posterior tibial 2+ R posterior tibial 2+  L dorsalis pedis 2+ R dorsalis pedis 2+   Neurologic: Alert and oriented X 3, normal strength and tone. Normal symmetric reflexes. Normal coordination and gait Incision/Wound: c/d/i  Disposition: Discharge disposition: 01-Home or Self Care       Discharge Instructions     CPM   Complete by: As directed    Continuous passive motion machine (CPM):       Use the CPM from 0 to 90 degrees for 6 hours per day.      You may break it up into 2 or 3 sessions per day.      Use CPM for 3 weeks or until you are told to stop.   Call MD / Call 911   Complete by: As directed    If you experience chest pain or shortness of breath, CALL 911 and be transported to the hospital emergency room.  If you develope a fever above 101 F, pus (white drainage) or increased drainage or redness at the wound, or calf pain, call your surgeon's office.   Diet - low sodium heart healthy   Complete by: As directed    Discharge instructions   Complete by: As directed    You may bear weight as tolerated. Keep your dressing on and dry until follow up. Take medicine to prevent blood clots as directed. Take pain medicine as needed with the goal of transitioning to over the counter medicines.    INSTRUCTIONS AFTER JOINT REPLACEMENT   Remove items at home which could result in a fall. This includes throw rugs or furniture in walking pathways ICE to the affected joint every three hours while awake for 30 minutes at a time, for at least the first 3-5 days, and then as needed for pain and swelling.  Continue to use ice for pain and swelling. You may notice swelling that will progress down to the foot and ankle.  This is normal after surgery.  Elevate your leg when you are not up walking on it.   Continue to use the breathing machine you got in the hospital (  incentive spirometer) which will help keep your temperature down.  It is common for your temperature to cycle up and down following surgery, especially at night when you are not up moving around and exerting yourself.  The breathing machine keeps your lungs expanded and your temperature down.   DIET:  As you were doing prior to hospitalization, we recommend a well-balanced diet.  DRESSING / WOUND CARE / SHOWERING  You may shower 3 days after surgery, but keep the wounds dry during showering.  You may use an occlusive plastic  wrap (Press'n Seal for example) with blue painter's tape at edges, NO SOAKING/SUBMERGING IN THE BATHTUB.  If the bandage gets wet, call the office.   ACTIVITY  Increase activity slowly as tolerated, but follow the weight bearing instructions below.   No driving for 6 weeks or until further direction given by your physician.  You cannot drive while taking narcotics.  No lifting or carrying greater than 10 lbs. until further directed by your surgeon. Avoid periods of inactivity such as sitting longer than an hour when not asleep. This helps prevent blood clots.  You may return to work once you are authorized by your doctor.    WEIGHT BEARING   Weight bearing as tolerated with assist device (walker, cane, etc) as directed, use it as long as suggested by your surgeon or therapist, typically at least 4-6 weeks.   EXERCISES  Results after joint replacement surgery are often greatly improved when you follow the exercise, range of motion and muscle strengthening exercises prescribed by your doctor. Safety measures are also important to protect the joint from further injury. Any time any of these exercises cause you to have increased pain or swelling, decrease what you are doing until you are comfortable again and then slowly increase them. If you have problems or questions, call your caregiver or physical therapist for advice.   Rehabilitation is important following a joint replacement. After just a few days of immobilization, the muscles of the leg can become weakened and shrink (atrophy).  These exercises are designed to build up the tone and strength of the thigh and leg muscles and to improve motion. Often times heat used for twenty to thirty minutes before working out will loosen up your tissues and help with improving the range of motion but do not use heat for the first two weeks following surgery (sometimes heat can increase post-operative swelling).   These exercises can be done on a training  (exercise) mat, on the floor, on a table or on a bed. Use whatever works the best and is most comfortable for you.    Use music or television while you are exercising so that the exercises are a pleasant break in your day. This will make your life better with the exercises acting as a break in your routine that you can look forward to.   Perform all exercises about fifteen times, three times per day or as directed.  You should exercise both the operative leg and the other leg as well.  Exercises include:   Quad Sets - Tighten up the muscle on the front of the thigh (Quad) and hold for 5-10 seconds.   Straight Leg Raises - With your knee straight (if you were given a brace, keep it on), lift the leg to 60 degrees, hold for 3 seconds, and slowly lower the leg.  Perform this exercise against resistance later as your leg gets stronger.  Leg Slides: Lying on your back, slowly  slide your foot toward your buttocks, bending your knee up off the floor (only go as far as is comfortable). Then slowly slide your foot back down until your leg is flat on the floor again.  Angel Wings: Lying on your back spread your legs to the side as far apart as you can without causing discomfort.  Hamstring Strength:  Lying on your back, push your heel against the floor with your leg straight by tightening up the muscles of your buttocks.  Repeat, but this time bend your knee to a comfortable angle, and push your heel against the floor.  You may put a pillow under the heel to make it more comfortable if necessary.   A rehabilitation program following joint replacement surgery can speed recovery and prevent re-injury in the future due to weakened muscles. Contact your doctor or a physical therapist for more information on knee rehabilitation.    CONSTIPATION  Constipation is defined medically as fewer than three stools per week and severe constipation as less than one stool per week.  Even if you have a regular bowel pattern at  home, your normal regimen is likely to be disrupted due to multiple reasons following surgery.  Combination of anesthesia, postoperative narcotics, change in appetite and fluid intake all can affect your bowels.   YOU MUST use at least one of the following options; they are listed in order of increasing strength to get the job done.  They are all available over the counter, and you may need to use some, POSSIBLY even all of these options:    Drink plenty of fluids (prune juice may be helpful) and high fiber foods Colace 100 mg by mouth twice a day  Senokot for constipation as directed and as needed Dulcolax (bisacodyl), take with full glass of water  Miralax (polyethylene glycol) once or twice a day as needed.  If you have tried all these things and are unable to have a bowel movement in the first 3-4 days after surgery call either your surgeon or your primary doctor.    If you experience loose stools or diarrhea, hold the medications until you stool forms back up.  If your symptoms do not get better within 1 week or if they get worse, check with your doctor.  If you experience "the worst abdominal pain ever" or develop nausea or vomiting, please contact the office immediately for further recommendations for treatment.   ITCHING:  If you experience itching with your medications, try taking only a single pain pill, or even half a pain pill at a time.  You can also use Benadryl over the counter for itching or also to help with sleep.   TED HOSE STOCKINGS:  Use stockings on both legs until for at least 2 weeks or as directed by physician office. They may be removed at night for sleeping.  MEDICATIONS:  See your medication summary on the "After Visit Summary" that nursing will review with you.  You may have some home medications which will be placed on hold until you complete the course of blood thinner medication.  It is important for you to complete the blood thinner medication as prescribed.  Take  medicines as prescribed.   You have several different medicines that work in different ways. - Oxycodone is a narcotic pain medicine.  Take this for severe pain. This medicine can be dehydrating / constipating. - Zofran is for nausea and vomiting. - Aspirin is to prevent blood clots after surgery. YOU MUST  TAKE THIS MEDICINE!  PRECAUTIONS:  If you experience chest pain or shortness of breath - call 911 immediately for transfer to the hospital emergency department.   If you develop a fever greater that 101 F, purulent drainage from wound, increased redness or drainage from wound, foul odor from the wound/dressing, or calf pain - CONTACT YOUR SURGEON.                                                   FOLLOW-UP APPOINTMENTS:  If you do not already have a post-op appointment, please call the office [phone] for an appointment to be seen by Dr. Percell Miller in 2 weeks.   OTHER INSTRUCTIONS:   MAKE SURE YOU:  Understand these instructions.  Get help right away if you are not doing well or get worse.    Thank you for letting us be a part of your medical care team.  It is a privilege we respect greatly.  We hope these instructions will help you stay on track for a fast and full recovery!   Do not put a pillow under the knee. Place it under the heel.   Complete by: As directed    Driving restrictions   Complete by: As directed    No driving for 2 weeks   Post-operative opioid taper instructions:   Complete by: As directed    POST-OPERATIVE OPIOID TAPER INSTRUCTIONS: It is important to wean off of your opioid medication as soon as possible. If you do not need pain medication after your surgery it is ok to stop day one. Opioids include: Codeine, Hydrocodone(Norco, Vicodin), Oxycodone(Percocet, oxycontin) and hydromorphone amongst others.  Long term and even short term use of opiods can cause: Increased pain response Dependence Constipation Depression Respiratory depression And more.   Withdrawal symptoms can include Flu like symptoms Nausea, vomiting And more Techniques to manage these symptoms Hydrate well Eat regular healthy meals Stay active Use relaxation techniques(deep breathing, meditating, yoga) Do Not substitute Alcohol to help with tapering If you have been on opioids for less than two weeks and do not have pain than it is ok to stop all together.  Plan to wean off of opioids This plan should start within one week post op of your joint replacement. Maintain the same interval or time between taking each dose and first decrease the dose.  Cut the total daily intake of opioids by one tablet each day Next start to increase the time between doses. The last dose that should be eliminated is the evening dose.      TED hose   Complete by: As directed    Use stockings (TED hose) for 2 weeks on left leg(s).  You may remove them at night for sleeping.   Weight bearing as tolerated   Complete by: As directed       Allergies as of 03/22/2021       Reactions   Diflunisal    Other Other (See Comments)   MD advised to not take muscle relaxers   Stadol [butorphanol] Nausea And Vomiting, Other (See Comments)   BP bottoms out        Medication List     TAKE these medications    amLODipine 10 MG tablet Commonly known as: NORVASC Take 10-15 mg by mouth every morning. Depends on blood pressure   aspirin EC  81 MG tablet Take 1 tablet (81 mg total) by mouth 2 (two) times daily. For DVT prophylaxis for 30 days after surgery. What changed: Another medication with the same name was added. Make sure you understand how and when to take each.   aspirin EC 81 MG tablet Take 1 tablet (81 mg total) by mouth 2 (two) times daily. For DVT prophylaxis for 30 days after surgery. What changed: You were already taking a medication with the same name, and this prescription was added. Make sure you understand how and when to take each.   augmented betamethasone  dipropionate 0.05 % ointment Commonly known as: DIPROLENE-AF Apply 1 application topically 2 (two) times daily as needed.   BIOTIN PO Take 1 tablet by mouth 2 (two) times a week.   docusate calcium 240 MG capsule Commonly known as: SURFAK Take 240 mg by mouth as needed for mild constipation.   HYDROcodone-acetaminophen 10-325 MG tablet Commonly known as: Norco Take 1 tablet by mouth every 6 (six) hours as needed for severe pain.   MECLIZINE HCL PO Take 1 tablet by mouth as needed (Dizziness).   metoprolol succinate 100 MG 24 hr tablet Commonly known as: TOPROL-XL Take 100-150 mg by mouth every morning. Depends on the blood pressure   omeprazole 20 MG capsule Commonly known as: PRILOSEC Take 20 mg by mouth daily as needed (for heartburn/indigestion.). 3 times per week   ondansetron 4 MG disintegrating tablet Commonly known as: ZOFRAN-ODT Take 1 tablet (4 mg total) by mouth 2 (two) times daily as needed for nausea or vomiting.   ondansetron 4 MG tablet Commonly known as: Zofran Take 1 tablet (4 mg total) by mouth every 8 (eight) hours as needed for nausea or vomiting.   oxyCODONE-acetaminophen 10-325 MG tablet Commonly known as: PERCOCET Take 1-2 tablets by mouth 3 (three) times daily. Additional of needed   VITAMIN D3 PO Take 1 capsule by mouth daily.               Discharge Care Instructions  (From admission, onward)           Start     Ordered   03/22/21 0000  Weight bearing as tolerated        03/22/21 1619            Follow-up Information     Renette Butters, MD. Go on 04/05/2021.   Specialty: Orthopedic Surgery Why: Your appointment is scheduled for 4:15. Contact information: [address]  [place] [address] [phone]         [facility] Follow up.   Specialty: Home Health Services Why: HHPT will provide 6 home therapy visits prior to starting in the outpatient setting Contact  information: [place]  [phone]         [facility]. Go on 04/06/2021.   Why: Your outpatient physical therapy is scheduled for 1:00. Please arrive at 12:45 to complete your paperwork. Contact information: [phone]                Signed: Britt Bottom PA-C 03/22/2021, 4:19 PM

## 2021-03-22 NOTE — Plan of Care (Signed)
°  Problem: Education: Goal: Knowledge of General Education information will improve Description: Including pain rating scale, medication(s)/side effects and non-pharmacologic comfort measures Outcome: Adequate for Discharge   Problem: Health Behavior/Discharge Planning: Goal: Ability to manage health-related needs will improve Outcome: Adequate for Discharge   Problem: Clinical Measurements: Goal: Ability to maintain clinical measurements within normal limits will improve Outcome: Adequate for Discharge Goal: Will remain free from infection Outcome: Adequate for Discharge Goal: Diagnostic test results will improve Outcome: Adequate for Discharge Goal: Respiratory complications will improve Outcome: Adequate for Discharge Goal: Cardiovascular complication will be avoided Outcome: Adequate for Discharge   Problem: Activity: Goal: Risk for activity intolerance will decrease Outcome: Adequate for Discharge   Problem: Nutrition: Goal: Adequate nutrition will be maintained Outcome: Adequate for Discharge   Problem: Coping: Goal: Level of anxiety will decrease Outcome: Adequate for Discharge   Problem: Elimination: Goal: Will not experience complications related to bowel motility Outcome: Adequate for Discharge Goal: Will not experience complications related to urinary retention Outcome: Adequate for Discharge   Problem: Pain Managment: Goal: General experience of comfort will improve Outcome: Adequate for Discharge   Problem: Safety: Goal: Ability to remain free from injury will improve Outcome: Adequate for Discharge   Problem: Education: Goal: Knowledge of the prescribed therapeutic regimen will improve Outcome: Adequate for Discharge Goal: Individualized Educational Video(s) Outcome: Adequate for Discharge   Problem: Activity: Goal: Ability to avoid complications of mobility impairment will improve Outcome: Adequate for Discharge Goal: Range of joint motion will  improve Outcome: Adequate for Discharge   Problem: Clinical Measurements: Goal: Postoperative complications will be avoided or minimized Outcome: Adequate for Discharge   Problem: Pain Management: Goal: Pain level will decrease with appropriate interventions Outcome: Adequate for Discharge   Problem: Acute Rehab PT Goals(only PT should resolve) Goal: Pt Will Go Supine/Side To Sit Outcome: Adequate for Discharge Goal: Pt Will Go Sit To Supine/Side Outcome: Adequate for Discharge Goal: Patient Will Transfer Sit To/From Stand Outcome: Adequate for Discharge Goal: Pt Will Transfer Bed To Chair/Chair To Bed Outcome: Adequate for Discharge Goal: Pt Will Ambulate Outcome: Adequate for Discharge Goal: Pt Will Go Up/Down Stairs Outcome: Adequate for Discharge

## 2021-03-22 NOTE — Progress Notes (Signed)
° ° °  Subjective: Patient reports pain as moderate. Was better yesterday. Reports a lot of pain behind the knee. Had a tough night. Didn't sleep well at all. Tolerating diet.  Urinating. No CP, SOB. Worked well with PT yesterday on mobilizing OOB.   Reports a difficult night with the nursing staff. Says the nurse refused to take ice off her knee all night long and it became very painful with the ice on that long. Also says the nurse tried to give her a muscle relaxer at some point despite me not ordering one and an allergy listed in her chart against them. When she told the nurse about the allergy, she was told the allergy band was only for food allergies.   Objective:   VITALS:   Vitals:   03/21/21 1800 03/21/21 2054 03/22/21 0135 03/22/21 0532  BP: (!) 153/88 135/74 (!) 154/70 (!) 148/77  Pulse: 89 72 64 73  Resp: 20 17 17 16   Temp: 98 F (36.7 C) 98.1 F (36.7 C) 97.8 F (36.6 C) 98.5 F (36.9 C)  TempSrc: Oral Oral Oral Oral  SpO2: 100% 98% 99% 98%  Weight:      Height:       CBC Latest Ref Rng & Units 03/09/2021 03/02/2019  WBC 4.0 - 10.5 K/uL 9.8 7.4  Hemoglobin 12.0 - 15.0 g/dL 11.7(L) 11.9(L)  Hematocrit 36.0 - 46.0 % 37.3 38.6  Platelets 150 - 400 K/uL 299 252   BMP Latest Ref Rng & Units 03/09/2021 03/02/2019  Glucose 70 - 99 mg/dL 116(H) 102(H)  BUN 8 - 23 mg/dL 21 18  Creatinine 0.44 - 1.00 mg/dL 0.84 0.72  Sodium 135 - 145 mmol/L 139 140  Potassium 3.5 - 5.1 mmol/L 4.0 3.7  Chloride 98 - 111 mmol/L 104 103  CO2 22 - 32 mmol/L 27 26  Calcium 8.9 - 10.3 mg/dL 9.4 9.3   Intake/Output      02/21 0701 02/22 0700 02/22 0701 02/23 0700   P.O. 1110    I.V. (mL/kg) 2300 (37.3)    IV Piggyback 400    Total Intake(mL/kg) 3810 (61.8)    Urine (mL/kg/hr) 5300 200 (1.3)   Blood 50    Total Output 5350 200   Net -1540 -200           Physical Exam: General: NAD. Sitting up in bedside chair, calm, talkative Resp: No increased wob, slight occasional cough Cardio: regular  rate and rhythm ABD soft Neurologically intact MSK Neurovascularly intact Sensation intact distally Intact pulses distally Dorsiflexion/Plantar flexion intact Incision: dressing C/D/I   Assessment: 1 Day Post-Op  S/P Procedure(s) (LRB): TOTAL KNEE ARTHROPLASTY (Left) by Dr. Ernesta Amble. Percell Miller on 03/21/21  Principal Problem:   S/P total knee arthroplasty, left   Plan:  Advance diet Up with therapy Incentive Spirometry Elevate and Apply ice  Weightbearing: WBAT LLE Insicional and dressing care: Dressings left intact until follow-up and Reinforce dressings as needed Orthopedic device(s):  CPM and foam block Showering: Keep dressing dry VTE prophylaxis: Aspirin 81mg  BID  x 30 days , SCDs, ambulation Pain control: will switch to oxycodone since Norco not helping Follow - up plan: 2 weeks Contact information:  Edmonia Lynch MD, Aggie Moats PA-C  Dispo: Home hopefully later today if passes PT eval and pain better controlled.     Britt Bottom, PA-C Office [phone] 03/22/2021, 9:34 AM

## 2021-03-22 NOTE — Progress Notes (Signed)
Physical Therapy Treatment Patient Details Name: Deanna Wright MRN: [id number] DOB: [birth year] Today's Date: 03/22/2021   History of Present Illness Pt is 81 yo female s/p L TKA on 03/21/21.  Pt with hx including but not limited to DJD, OA, HTN, scoliosis, R THA in 2021.    PT Comments    Patient making good progress. Start of this session pt seemed limited by pain but continues to mobilize well and demonstrated good recall for safe proximity/management of RW. Pt completed transfers, gait, and stair negotiation with guarding from Deanna Wright and supervision from therapist. No LOB noted throughout. Reviewed HEP for supine exercises and pt will continue to progress with HHPT. Pt continues to have questions regarding medications, recommended pt work with MD and RN to create best pain management plan and to utilize ice to continue managing edema and pain. Pt is mobilizing at safe level for discharge home with assist from Deanna Wright. Will continue to progress if pt remains in acute setting.     Recommendations for follow up therapy are one component of a multi-disciplinary discharge planning process, led by the attending physician.  Recommendations may be updated based on patient status, additional functional criteria and insurance authorization.  Follow Up Recommendations  Follow physician's recommendations for discharge plan and follow up therapies     Assistance Recommended at Discharge Frequent or constant Supervision/Assistance  Patient can return home with the following A little help with walking and/or transfers;A little help with bathing/dressing/bathroom;Assistance with cooking/housework;Help with stairs or ramp for entrance   Equipment Recommendations  None recommended by PT    Recommendations for Other Services       Precautions / Restrictions Precautions Precautions: Fall Restrictions Weight Bearing Restrictions: No LLE Weight Bearing: Weight bearing as tolerated     Mobility   Bed Mobility Overal bed mobility: Needs Assistance Bed Mobility: Sit to Supine       Sit to supine: Min guard   General bed mobility comments: pt OOB in recliner at start of session. Cues to use belt to assist Lt LE onto EOB to return to supine, min guard for safety.    Transfers Overall transfer level: Needs assistance Equipment used: Rolling walker (2 wheels) Transfers: Sit to/from Stand Sit to Stand: Supervision           General transfer comment: pt with good recall for hand placement with power up. cues needed to extend Lt knee with return to sit. sueprvision for safety.    Ambulation/Gait Ambulation/Gait assistance: Min guard, Supervision Gait Distance (Feet): 80 Feet Assistive device: Rolling walker (2 wheels) Gait Pattern/deviations: Step-to pattern, Decreased stride length Gait velocity: fair     General Gait Details: pt wtih good recall for safe proximity to RW, no LOB noted or buckling at knee. pt's Deanna Wright provided safe gaurding with supervision and cues from therapist.   Stairs Stairs: Yes Stairs assistance: Supervision, Min guard Stair Management: No rails, Step to pattern, Forwards, Backwards, With walker Number of Stairs: 1 (2x1) General stair comments: cues for sequenicng "up with good, down with bad" for safe curb negotiation. pt complete 1 step with min guard from Deanna Wright to steady walker and completed with forward and reverse step up technique. no LOB noted.   Wheelchair Mobility    Modified Rankin (Stroke Patients Only)       Balance Overall balance assessment: Needs assistance Sitting-balance support: No upper extremity supported Sitting balance-Leahy Scale: Normal     Standing balance support: Bilateral upper extremity supported, Reliant on assistive  device for balance Standing balance-Leahy Scale: Poor                              Cognition Arousal/Alertness: Awake/alert Behavior During Therapy: WFL for tasks  assessed/performed Overall Cognitive Status: Within Functional Limits for tasks assessed                                          Exercises Total Joint Exercises Ankle Circles/Pumps: AROM, Both, 10 reps, Supine Quad Sets: AROM, Left, 5 reps, Supine Heel Slides: AAROM, Left, 5 reps, Supine Hip ABduction/ADduction: AAROM, Left, 5 reps, Supine    General Comments        Pertinent Vitals/Pain Pain Assessment Pain Assessment: 0-10 Pain Score: 8  Pain Location: L knee Pain Descriptors / Indicators: Discomfort Pain Intervention(s): Limited activity within patient's tolerance, Monitored during session, Repositioned, Premedicated before session    Home Living                          Prior Function            PT Goals (current goals can now be found in the care plan section) Acute Rehab PT Goals Patient Stated Goal: return home PT Goal Formulation: With patient Time For Goal Achievement: 04/04/21 Potential to Achieve Goals: Good Progress towards PT goals: Progressing toward goals    Frequency    7X/week      PT Plan Current plan remains appropriate    Co-evaluation              AM-PAC PT "6 Clicks" Mobility   Outcome Measure  Help needed turning from your back to your side while in a flat bed without using bedrails?: A Little Help needed moving from lying on your back to sitting on the side of a flat bed without using bedrails?: A Little Help needed moving to and from a bed to a chair (including a wheelchair)?: A Little Help needed standing up from a chair using your arms (e.g., wheelchair or bedside chair)?: A Little Help needed to walk in hospital room?: A Little Help needed climbing 3-5 steps with a railing? : A Little 6 Click Score: 18    End of Session Equipment Utilized During Treatment: Gait belt Activity Tolerance: Patient tolerated treatment well Patient left: in chair;with call bell/phone within reach;with chair alarm  set Nurse Communication: Mobility status PT Visit Diagnosis: Other abnormalities of gait and mobility (R26.89);Muscle weakness (generalized) (M62.81)     Time: 9450-3888 PT Time Calculation (min) (ACUTE ONLY): 36 min  Charges:  $Gait Training: 8-22 mins $Therapeutic Exercise: 8-22 mins                     Deanna Wright, Deanna Wright Acute Rehabilitation Services Office [phone] Pager [phone]    Deanna Wright 03/22/2021, 3:22 PM

## 2021-03-22 NOTE — TOC Transition Note (Signed)
Transition of Care [facility]) - CM/SW Discharge Note   Patient Details  Name: Deanna Wright MRN: [id number] Date of Birth: August 24, 1940  Transition of Care [facility]) CM/SW Contact:  Lennart Pall, LCSW Phone Number: 03/22/2021, 10:06 AM   Clinical Narrative:    Met with pt and confirming she has received rolling walker via [place].  Pt aware HHPT prearranged with [facility] via MD office.  No TOC needs.   Final next level of care: [place] Barriers to Discharge: No Barriers Identified   Patient Goals and CMS Choice Patient states their goals for this hospitalization and ongoing recovery are:: return home      Discharge Placement                       Discharge Plan and Services                DME Arranged: Walker rolling, CPM DME Agency: [employer]       HH Arranged: PT [place] Agency: [place]        Social Determinants of Health (SDOH) Interventions     Readmission Risk Interventions No flowsheet data found.

## 2021-03-22 NOTE — Progress Notes (Signed)
Physical Therapy Treatment Patient Details Name: Deanna Wright MRN: [id number] DOB: October 02, 1940 Today's Date: 03/22/2021   History of Present Illness Pt is 81 yo female s/p L TKA on 03/21/21.  Pt with hx including but not limited to DJD, OA, HTN, scoliosis, R THA in 2021.    PT Comments    Patient making good progress with mobility and min guard requires with min cues for safe technique using RW with transfers and gait. No LOB noted throughout and pt tolerated ~190' ambulation today. Patient completed seated exercises for ROM to Lt knee and educated on duration for icing knee. Acute PT will follow up for additional session to complete curb training and finalize HEP.    Recommendations for follow up therapy are one component of a multi-disciplinary discharge planning process, led by the attending physician.  Recommendations may be updated based on patient status, additional functional criteria and insurance authorization.  Follow Up Recommendations  Follow physician's recommendations for discharge plan and follow up therapies     Assistance Recommended at Discharge Frequent or constant Supervision/Assistance  Patient can return home with the following A little help with walking and/or transfers;A little help with bathing/dressing/bathroom;Assistance with cooking/housework;Help with stairs or ramp for entrance   Equipment Recommendations  None recommended by PT    Recommendations for Other Services       Precautions / Restrictions Precautions Precautions: Fall Restrictions Weight Bearing Restrictions: No LLE Weight Bearing: Weight bearing as tolerated     Mobility  Bed Mobility               General bed mobility comments: pt OOB in reclinr    Transfers Overall transfer level: Needs assistance Equipment used: Rolling walker (2 wheels) Transfers: Sit to/from Stand Sit to Stand: Min guard           General transfer comment: Cues for hand placement and Lt LE  positioning. guard for safety    Ambulation/Gait Ambulation/Gait assistance: Min guard Gait Distance (Feet): 190 Feet Assistive device: Rolling walker (2 wheels) Gait Pattern/deviations: Step-to pattern, Decreased stride length Gait velocity: decreased     General Gait Details: Min cues for sequencing and RW proxmity, no buckling or LOB noted.   Stairs             Wheelchair Mobility    Modified Rankin (Stroke Patients Only)       Balance Overall balance assessment: Needs assistance Sitting-balance support: No upper extremity supported Sitting balance-Leahy Scale: Normal     Standing balance support: Bilateral upper extremity supported, Reliant on assistive device for balance Standing balance-Leahy Scale: Poor                              Cognition Arousal/Alertness: Awake/alert Behavior During Therapy: WFL for tasks assessed/performed Overall Cognitive Status: Within Functional Limits for tasks assessed                                          Exercises Total Joint Exercises Ankle Circles/Pumps: AROM, Both, Seated, 10 reps Quad Sets: AROM, Left, 5 reps, Seated Heel Slides: AAROM, Left, 5 reps, Seated Hip ABduction/ADduction: AAROM, Left, 5 reps, Seated    General Comments        Pertinent Vitals/Pain Pain Assessment Pain Assessment: 0-10 Pain Score: 2  Pain Location: L knee Pain Descriptors / Indicators: Discomfort Pain  Intervention(s): Limited activity within patient's tolerance, Monitored during session, Repositioned, Premedicated before session    Home Living                          Prior Function            PT Goals (current goals can now be found in the care plan section) Acute Rehab PT Goals Patient Stated Goal: return home PT Goal Formulation: With patient Time For Goal Achievement: 04/04/21 Potential to Achieve Goals: Good    Frequency    7X/week      PT Plan Current plan remains  appropriate    Co-evaluation              AM-PAC PT "6 Clicks" Mobility   Outcome Measure  Help needed turning from your back to your side while in a flat bed without using bedrails?: A Little Help needed moving from lying on your back to sitting on the side of a flat bed without using bedrails?: A Little Help needed moving to and from a bed to a chair (including a wheelchair)?: A Little Help needed standing up from a chair using your arms (e.g., wheelchair or bedside chair)?: A Little Help needed to walk in hospital room?: A Little Help needed climbing 3-5 steps with a railing? : A Little 6 Click Score: 18    End of Session Equipment Utilized During Treatment: Gait belt Activity Tolerance: Patient tolerated treatment well Patient left: in chair;with call bell/phone within reach;with chair alarm set Nurse Communication: Mobility status PT Visit Diagnosis: Other abnormalities of gait and mobility (R26.89);Muscle weakness (generalized) (M62.81)     Time: 3662-9476 PT Time Calculation (min) (ACUTE ONLY): 22 min  Charges:  $Gait Training: 8-22 mins                     Verner Mould, DPT Acute Rehabilitation Services Office [phone] Pager [phone]    Jacques Navy 03/22/2021, 1:49 PM

## 2021-03-22 NOTE — Progress Notes (Signed)
Discharge instructions given to patient and sister.multiple questions asked and answered. Bethann Punches RN

## 2021-03-24 DIAGNOSIS — Z86010 Personal history of colonic polyps: Secondary | ICD-10-CM | POA: Diagnosis not present

## 2021-03-24 DIAGNOSIS — M8580 Other specified disorders of bone density and structure, unspecified site: Secondary | ICD-10-CM | POA: Diagnosis not present

## 2021-03-24 DIAGNOSIS — J45909 Unspecified asthma, uncomplicated: Secondary | ICD-10-CM | POA: Diagnosis not present

## 2021-03-24 DIAGNOSIS — I10 Essential (primary) hypertension: Secondary | ICD-10-CM | POA: Diagnosis not present

## 2021-03-24 DIAGNOSIS — Z96652 Presence of left artificial knee joint: Secondary | ICD-10-CM | POA: Diagnosis not present

## 2021-03-24 DIAGNOSIS — Z471 Aftercare following joint replacement surgery: Secondary | ICD-10-CM | POA: Diagnosis not present

## 2021-03-24 DIAGNOSIS — K219 Gastro-esophageal reflux disease without esophagitis: Secondary | ICD-10-CM | POA: Diagnosis not present

## 2021-03-24 DIAGNOSIS — Z86018 Personal history of other benign neoplasm: Secondary | ICD-10-CM | POA: Diagnosis not present

## 2021-03-24 DIAGNOSIS — M419 Scoliosis, unspecified: Secondary | ICD-10-CM | POA: Diagnosis not present

## 2021-03-24 DIAGNOSIS — Z7982 Long term (current) use of aspirin: Secondary | ICD-10-CM | POA: Diagnosis not present

## 2021-03-24 DIAGNOSIS — E559 Vitamin D deficiency, unspecified: Secondary | ICD-10-CM | POA: Diagnosis not present

## 2021-03-24 DIAGNOSIS — Z9181 History of falling: Secondary | ICD-10-CM | POA: Diagnosis not present

## 2021-03-24 DIAGNOSIS — Z9089 Acquired absence of other organs: Secondary | ICD-10-CM | POA: Diagnosis not present

## 2021-03-24 DIAGNOSIS — Z96641 Presence of right artificial hip joint: Secondary | ICD-10-CM | POA: Diagnosis not present

## 2021-03-28 DIAGNOSIS — Z471 Aftercare following joint replacement surgery: Secondary | ICD-10-CM | POA: Diagnosis not present

## 2021-03-28 DIAGNOSIS — M8580 Other specified disorders of bone density and structure, unspecified site: Secondary | ICD-10-CM | POA: Diagnosis not present

## 2021-03-28 DIAGNOSIS — J45909 Unspecified asthma, uncomplicated: Secondary | ICD-10-CM | POA: Diagnosis not present

## 2021-03-28 DIAGNOSIS — K219 Gastro-esophageal reflux disease without esophagitis: Secondary | ICD-10-CM | POA: Diagnosis not present

## 2021-03-28 DIAGNOSIS — E559 Vitamin D deficiency, unspecified: Secondary | ICD-10-CM | POA: Diagnosis not present

## 2021-03-28 DIAGNOSIS — M419 Scoliosis, unspecified: Secondary | ICD-10-CM | POA: Diagnosis not present

## 2021-03-28 DIAGNOSIS — Z86010 Personal history of colonic polyps: Secondary | ICD-10-CM | POA: Diagnosis not present

## 2021-03-28 DIAGNOSIS — Z7982 Long term (current) use of aspirin: Secondary | ICD-10-CM | POA: Diagnosis not present

## 2021-03-28 DIAGNOSIS — Z96641 Presence of right artificial hip joint: Secondary | ICD-10-CM | POA: Diagnosis not present

## 2021-03-28 DIAGNOSIS — Z86018 Personal history of other benign neoplasm: Secondary | ICD-10-CM | POA: Diagnosis not present

## 2021-03-28 DIAGNOSIS — I10 Essential (primary) hypertension: Secondary | ICD-10-CM | POA: Diagnosis not present

## 2021-03-28 DIAGNOSIS — Z9181 History of falling: Secondary | ICD-10-CM | POA: Diagnosis not present

## 2021-03-28 DIAGNOSIS — Z9089 Acquired absence of other organs: Secondary | ICD-10-CM | POA: Diagnosis not present

## 2021-03-28 DIAGNOSIS — Z96652 Presence of left artificial knee joint: Secondary | ICD-10-CM | POA: Diagnosis not present

## 2021-03-29 DIAGNOSIS — M1712 Unilateral primary osteoarthritis, left knee: Secondary | ICD-10-CM | POA: Diagnosis not present

## 2021-03-29 DIAGNOSIS — Z96652 Presence of left artificial knee joint: Secondary | ICD-10-CM | POA: Diagnosis not present

## 2021-03-30 DIAGNOSIS — Z96652 Presence of left artificial knee joint: Secondary | ICD-10-CM | POA: Diagnosis not present

## 2021-03-30 DIAGNOSIS — Z9089 Acquired absence of other organs: Secondary | ICD-10-CM | POA: Diagnosis not present

## 2021-03-30 DIAGNOSIS — Z9181 History of falling: Secondary | ICD-10-CM | POA: Diagnosis not present

## 2021-03-30 DIAGNOSIS — K219 Gastro-esophageal reflux disease without esophagitis: Secondary | ICD-10-CM | POA: Diagnosis not present

## 2021-03-30 DIAGNOSIS — Z86010 Personal history of colonic polyps: Secondary | ICD-10-CM | POA: Diagnosis not present

## 2021-03-30 DIAGNOSIS — I10 Essential (primary) hypertension: Secondary | ICD-10-CM | POA: Diagnosis not present

## 2021-03-30 DIAGNOSIS — Z86018 Personal history of other benign neoplasm: Secondary | ICD-10-CM | POA: Diagnosis not present

## 2021-03-30 DIAGNOSIS — J45909 Unspecified asthma, uncomplicated: Secondary | ICD-10-CM | POA: Diagnosis not present

## 2021-03-30 DIAGNOSIS — Z471 Aftercare following joint replacement surgery: Secondary | ICD-10-CM | POA: Diagnosis not present

## 2021-03-30 DIAGNOSIS — Z7982 Long term (current) use of aspirin: Secondary | ICD-10-CM | POA: Diagnosis not present

## 2021-03-30 DIAGNOSIS — E559 Vitamin D deficiency, unspecified: Secondary | ICD-10-CM | POA: Diagnosis not present

## 2021-03-30 DIAGNOSIS — Z96641 Presence of right artificial hip joint: Secondary | ICD-10-CM | POA: Diagnosis not present

## 2021-03-30 DIAGNOSIS — M419 Scoliosis, unspecified: Secondary | ICD-10-CM | POA: Diagnosis not present

## 2021-03-30 DIAGNOSIS — M8580 Other specified disorders of bone density and structure, unspecified site: Secondary | ICD-10-CM | POA: Diagnosis not present

## 2021-03-31 DIAGNOSIS — K219 Gastro-esophageal reflux disease without esophagitis: Secondary | ICD-10-CM | POA: Diagnosis not present

## 2021-03-31 DIAGNOSIS — Z96652 Presence of left artificial knee joint: Secondary | ICD-10-CM | POA: Diagnosis not present

## 2021-03-31 DIAGNOSIS — I10 Essential (primary) hypertension: Secondary | ICD-10-CM | POA: Diagnosis not present

## 2021-03-31 DIAGNOSIS — Z471 Aftercare following joint replacement surgery: Secondary | ICD-10-CM | POA: Diagnosis not present

## 2021-03-31 DIAGNOSIS — M8580 Other specified disorders of bone density and structure, unspecified site: Secondary | ICD-10-CM | POA: Diagnosis not present

## 2021-03-31 DIAGNOSIS — Z9181 History of falling: Secondary | ICD-10-CM | POA: Diagnosis not present

## 2021-03-31 DIAGNOSIS — Z86018 Personal history of other benign neoplasm: Secondary | ICD-10-CM | POA: Diagnosis not present

## 2021-03-31 DIAGNOSIS — Z9089 Acquired absence of other organs: Secondary | ICD-10-CM | POA: Diagnosis not present

## 2021-03-31 DIAGNOSIS — Z96641 Presence of right artificial hip joint: Secondary | ICD-10-CM | POA: Diagnosis not present

## 2021-03-31 DIAGNOSIS — M419 Scoliosis, unspecified: Secondary | ICD-10-CM | POA: Diagnosis not present

## 2021-03-31 DIAGNOSIS — Z7982 Long term (current) use of aspirin: Secondary | ICD-10-CM | POA: Diagnosis not present

## 2021-03-31 DIAGNOSIS — Z86010 Personal history of colonic polyps: Secondary | ICD-10-CM | POA: Diagnosis not present

## 2021-03-31 DIAGNOSIS — J45909 Unspecified asthma, uncomplicated: Secondary | ICD-10-CM | POA: Diagnosis not present

## 2021-03-31 DIAGNOSIS — E559 Vitamin D deficiency, unspecified: Secondary | ICD-10-CM | POA: Diagnosis not present

## 2021-04-03 DIAGNOSIS — K219 Gastro-esophageal reflux disease without esophagitis: Secondary | ICD-10-CM | POA: Diagnosis not present

## 2021-04-03 DIAGNOSIS — Z86010 Personal history of colonic polyps: Secondary | ICD-10-CM | POA: Diagnosis not present

## 2021-04-03 DIAGNOSIS — E559 Vitamin D deficiency, unspecified: Secondary | ICD-10-CM | POA: Diagnosis not present

## 2021-04-03 DIAGNOSIS — J45909 Unspecified asthma, uncomplicated: Secondary | ICD-10-CM | POA: Diagnosis not present

## 2021-04-03 DIAGNOSIS — Z96641 Presence of right artificial hip joint: Secondary | ICD-10-CM | POA: Diagnosis not present

## 2021-04-03 DIAGNOSIS — M8580 Other specified disorders of bone density and structure, unspecified site: Secondary | ICD-10-CM | POA: Diagnosis not present

## 2021-04-03 DIAGNOSIS — Z86018 Personal history of other benign neoplasm: Secondary | ICD-10-CM | POA: Diagnosis not present

## 2021-04-03 DIAGNOSIS — M419 Scoliosis, unspecified: Secondary | ICD-10-CM | POA: Diagnosis not present

## 2021-04-03 DIAGNOSIS — Z471 Aftercare following joint replacement surgery: Secondary | ICD-10-CM | POA: Diagnosis not present

## 2021-04-03 DIAGNOSIS — Z7982 Long term (current) use of aspirin: Secondary | ICD-10-CM | POA: Diagnosis not present

## 2021-04-03 DIAGNOSIS — Z96652 Presence of left artificial knee joint: Secondary | ICD-10-CM | POA: Diagnosis not present

## 2021-04-03 DIAGNOSIS — I10 Essential (primary) hypertension: Secondary | ICD-10-CM | POA: Diagnosis not present

## 2021-04-03 DIAGNOSIS — Z9181 History of falling: Secondary | ICD-10-CM | POA: Diagnosis not present

## 2021-04-03 DIAGNOSIS — Z9089 Acquired absence of other organs: Secondary | ICD-10-CM | POA: Diagnosis not present

## 2021-04-04 DIAGNOSIS — Z7982 Long term (current) use of aspirin: Secondary | ICD-10-CM | POA: Diagnosis not present

## 2021-04-04 DIAGNOSIS — I10 Essential (primary) hypertension: Secondary | ICD-10-CM | POA: Diagnosis not present

## 2021-04-04 DIAGNOSIS — E559 Vitamin D deficiency, unspecified: Secondary | ICD-10-CM | POA: Diagnosis not present

## 2021-04-04 DIAGNOSIS — Z471 Aftercare following joint replacement surgery: Secondary | ICD-10-CM | POA: Diagnosis not present

## 2021-04-04 DIAGNOSIS — Z96652 Presence of left artificial knee joint: Secondary | ICD-10-CM | POA: Diagnosis not present

## 2021-04-04 DIAGNOSIS — J45909 Unspecified asthma, uncomplicated: Secondary | ICD-10-CM | POA: Diagnosis not present

## 2021-04-04 DIAGNOSIS — M8580 Other specified disorders of bone density and structure, unspecified site: Secondary | ICD-10-CM | POA: Diagnosis not present

## 2021-04-04 DIAGNOSIS — Z9089 Acquired absence of other organs: Secondary | ICD-10-CM | POA: Diagnosis not present

## 2021-04-04 DIAGNOSIS — Z86010 Personal history of colonic polyps: Secondary | ICD-10-CM | POA: Diagnosis not present

## 2021-04-04 DIAGNOSIS — K219 Gastro-esophageal reflux disease without esophagitis: Secondary | ICD-10-CM | POA: Diagnosis not present

## 2021-04-04 DIAGNOSIS — M419 Scoliosis, unspecified: Secondary | ICD-10-CM | POA: Diagnosis not present

## 2021-04-04 DIAGNOSIS — Z96641 Presence of right artificial hip joint: Secondary | ICD-10-CM | POA: Diagnosis not present

## 2021-04-04 DIAGNOSIS — Z9181 History of falling: Secondary | ICD-10-CM | POA: Diagnosis not present

## 2021-04-04 DIAGNOSIS — Z86018 Personal history of other benign neoplasm: Secondary | ICD-10-CM | POA: Diagnosis not present

## 2021-04-05 DIAGNOSIS — M1712 Unilateral primary osteoarthritis, left knee: Secondary | ICD-10-CM | POA: Diagnosis not present

## 2021-04-10 DIAGNOSIS — R262 Difficulty in walking, not elsewhere classified: Secondary | ICD-10-CM | POA: Diagnosis not present

## 2021-04-10 DIAGNOSIS — M25562 Pain in left knee: Secondary | ICD-10-CM | POA: Diagnosis not present

## 2021-04-10 DIAGNOSIS — Z96652 Presence of left artificial knee joint: Secondary | ICD-10-CM | POA: Diagnosis not present

## 2021-04-12 DIAGNOSIS — R262 Difficulty in walking, not elsewhere classified: Secondary | ICD-10-CM | POA: Diagnosis not present

## 2021-04-12 DIAGNOSIS — M25562 Pain in left knee: Secondary | ICD-10-CM | POA: Diagnosis not present

## 2021-04-12 DIAGNOSIS — Z96652 Presence of left artificial knee joint: Secondary | ICD-10-CM | POA: Diagnosis not present

## 2021-04-13 DIAGNOSIS — M4727 Other spondylosis with radiculopathy, lumbosacral region: Secondary | ICD-10-CM | POA: Diagnosis not present

## 2021-04-13 DIAGNOSIS — Z96652 Presence of left artificial knee joint: Secondary | ICD-10-CM | POA: Diagnosis not present

## 2021-04-13 DIAGNOSIS — M170 Bilateral primary osteoarthritis of knee: Secondary | ICD-10-CM | POA: Diagnosis not present

## 2021-04-13 DIAGNOSIS — Z79891 Long term (current) use of opiate analgesic: Secondary | ICD-10-CM | POA: Diagnosis not present

## 2021-04-13 DIAGNOSIS — G894 Chronic pain syndrome: Secondary | ICD-10-CM | POA: Diagnosis not present

## 2021-04-16 ENCOUNTER — Other Ambulatory Visit: Payer: Self-pay

## 2021-04-16 ENCOUNTER — Ambulatory Visit (HOSPITAL_COMMUNITY)
Admission: RE | Admit: 2021-04-16 | Discharge: 2021-04-16 | Disposition: A | Discharge status: Home / Self Care | Payer: Medicare HMO | Source: Ambulatory Visit | Attending: Physician Assistant | Admitting: Physician Assistant

## 2021-04-16 ENCOUNTER — Other Ambulatory Visit: Payer: Self-pay | Admitting: Physician Assistant

## 2021-04-16 DIAGNOSIS — I80202 Phlebitis and thrombophlebitis of unspecified deep vessels of left lower extremity: Secondary | ICD-10-CM

## 2021-04-16 DIAGNOSIS — M1712 Unilateral primary osteoarthritis, left knee: Secondary | ICD-10-CM | POA: Diagnosis not present

## 2021-04-16 DIAGNOSIS — M79605 Pain in left leg: Secondary | ICD-10-CM | POA: Insufficient documentation

## 2021-04-16 NOTE — Progress Notes (Signed)
Post op total knee with left leg swelling  ?

## 2021-04-16 NOTE — Progress Notes (Signed)
VASCULAR LAB ? ? ? ?Left lower extremity venous duplex has been performed. ? ?See CV proc for preliminary results. ? ?Called report to [employer], PA-C ? ?Warren Kugelman, RVT ?04/16/2021, 3:32 PM ? ?

## 2021-04-24 DIAGNOSIS — Z471 Aftercare following joint replacement surgery: Secondary | ICD-10-CM | POA: Diagnosis not present

## 2021-04-24 DIAGNOSIS — M1712 Unilateral primary osteoarthritis, left knee: Secondary | ICD-10-CM | POA: Diagnosis not present

## 2021-04-24 DIAGNOSIS — R262 Difficulty in walking, not elsewhere classified: Secondary | ICD-10-CM | POA: Diagnosis not present

## 2021-04-24 DIAGNOSIS — M6281 Muscle weakness (generalized): Secondary | ICD-10-CM | POA: Diagnosis not present

## 2021-04-24 DIAGNOSIS — M25562 Pain in left knee: Secondary | ICD-10-CM | POA: Diagnosis not present

## 2021-04-26 DIAGNOSIS — Z471 Aftercare following joint replacement surgery: Secondary | ICD-10-CM | POA: Diagnosis not present

## 2021-04-26 DIAGNOSIS — R262 Difficulty in walking, not elsewhere classified: Secondary | ICD-10-CM | POA: Diagnosis not present

## 2021-04-26 DIAGNOSIS — M1712 Unilateral primary osteoarthritis, left knee: Secondary | ICD-10-CM | POA: Diagnosis not present

## 2021-04-26 DIAGNOSIS — M6281 Muscle weakness (generalized): Secondary | ICD-10-CM | POA: Diagnosis not present

## 2021-04-26 DIAGNOSIS — M25562 Pain in left knee: Secondary | ICD-10-CM | POA: Diagnosis not present

## 2021-04-28 DIAGNOSIS — R262 Difficulty in walking, not elsewhere classified: Secondary | ICD-10-CM | POA: Diagnosis not present

## 2021-04-28 DIAGNOSIS — M25562 Pain in left knee: Secondary | ICD-10-CM | POA: Diagnosis not present

## 2021-04-28 DIAGNOSIS — Z471 Aftercare following joint replacement surgery: Secondary | ICD-10-CM | POA: Diagnosis not present

## 2021-04-28 DIAGNOSIS — M1712 Unilateral primary osteoarthritis, left knee: Secondary | ICD-10-CM | POA: Diagnosis not present

## 2021-04-28 DIAGNOSIS — M6281 Muscle weakness (generalized): Secondary | ICD-10-CM | POA: Diagnosis not present

## 2021-05-01 DIAGNOSIS — M25562 Pain in left knee: Secondary | ICD-10-CM | POA: Diagnosis not present

## 2021-05-01 DIAGNOSIS — Z471 Aftercare following joint replacement surgery: Secondary | ICD-10-CM | POA: Diagnosis not present

## 2021-05-01 DIAGNOSIS — M1712 Unilateral primary osteoarthritis, left knee: Secondary | ICD-10-CM | POA: Diagnosis not present

## 2021-05-01 DIAGNOSIS — R262 Difficulty in walking, not elsewhere classified: Secondary | ICD-10-CM | POA: Diagnosis not present

## 2021-05-03 DIAGNOSIS — R262 Difficulty in walking, not elsewhere classified: Secondary | ICD-10-CM | POA: Diagnosis not present

## 2021-05-03 DIAGNOSIS — Z471 Aftercare following joint replacement surgery: Secondary | ICD-10-CM | POA: Diagnosis not present

## 2021-05-03 DIAGNOSIS — M1712 Unilateral primary osteoarthritis, left knee: Secondary | ICD-10-CM | POA: Diagnosis not present

## 2021-05-03 DIAGNOSIS — M25562 Pain in left knee: Secondary | ICD-10-CM | POA: Diagnosis not present

## 2021-05-05 DIAGNOSIS — M25562 Pain in left knee: Secondary | ICD-10-CM | POA: Diagnosis not present

## 2021-05-05 DIAGNOSIS — M1712 Unilateral primary osteoarthritis, left knee: Secondary | ICD-10-CM | POA: Diagnosis not present

## 2021-05-05 DIAGNOSIS — R262 Difficulty in walking, not elsewhere classified: Secondary | ICD-10-CM | POA: Diagnosis not present

## 2021-05-05 DIAGNOSIS — Z471 Aftercare following joint replacement surgery: Secondary | ICD-10-CM | POA: Diagnosis not present

## 2021-05-08 DIAGNOSIS — M1712 Unilateral primary osteoarthritis, left knee: Secondary | ICD-10-CM | POA: Diagnosis not present

## 2021-05-08 DIAGNOSIS — M25562 Pain in left knee: Secondary | ICD-10-CM | POA: Diagnosis not present

## 2021-05-08 DIAGNOSIS — Z471 Aftercare following joint replacement surgery: Secondary | ICD-10-CM | POA: Diagnosis not present

## 2021-05-08 DIAGNOSIS — R262 Difficulty in walking, not elsewhere classified: Secondary | ICD-10-CM | POA: Diagnosis not present

## 2021-05-09 DIAGNOSIS — M4727 Other spondylosis with radiculopathy, lumbosacral region: Secondary | ICD-10-CM | POA: Diagnosis not present

## 2021-05-09 DIAGNOSIS — M170 Bilateral primary osteoarthritis of knee: Secondary | ICD-10-CM | POA: Diagnosis not present

## 2021-05-09 DIAGNOSIS — Z96652 Presence of left artificial knee joint: Secondary | ICD-10-CM | POA: Diagnosis not present

## 2021-05-09 DIAGNOSIS — Z79891 Long term (current) use of opiate analgesic: Secondary | ICD-10-CM | POA: Diagnosis not present

## 2021-05-09 DIAGNOSIS — G894 Chronic pain syndrome: Secondary | ICD-10-CM | POA: Diagnosis not present

## 2021-05-11 DIAGNOSIS — Z471 Aftercare following joint replacement surgery: Secondary | ICD-10-CM | POA: Diagnosis not present

## 2021-05-11 DIAGNOSIS — R262 Difficulty in walking, not elsewhere classified: Secondary | ICD-10-CM | POA: Diagnosis not present

## 2021-05-11 DIAGNOSIS — M1712 Unilateral primary osteoarthritis, left knee: Secondary | ICD-10-CM | POA: Diagnosis not present

## 2021-05-11 DIAGNOSIS — M25562 Pain in left knee: Secondary | ICD-10-CM | POA: Diagnosis not present

## 2021-05-15 DIAGNOSIS — M6281 Muscle weakness (generalized): Secondary | ICD-10-CM | POA: Diagnosis not present

## 2021-05-15 DIAGNOSIS — R262 Difficulty in walking, not elsewhere classified: Secondary | ICD-10-CM | POA: Diagnosis not present

## 2021-05-15 DIAGNOSIS — M25562 Pain in left knee: Secondary | ICD-10-CM | POA: Diagnosis not present

## 2021-05-15 DIAGNOSIS — Z471 Aftercare following joint replacement surgery: Secondary | ICD-10-CM | POA: Diagnosis not present

## 2021-05-15 DIAGNOSIS — M1712 Unilateral primary osteoarthritis, left knee: Secondary | ICD-10-CM | POA: Diagnosis not present

## 2021-05-17 DIAGNOSIS — M79642 Pain in left hand: Secondary | ICD-10-CM | POA: Diagnosis not present

## 2021-05-18 DIAGNOSIS — R262 Difficulty in walking, not elsewhere classified: Secondary | ICD-10-CM | POA: Diagnosis not present

## 2021-05-18 DIAGNOSIS — M25562 Pain in left knee: Secondary | ICD-10-CM | POA: Diagnosis not present

## 2021-05-18 DIAGNOSIS — M1712 Unilateral primary osteoarthritis, left knee: Secondary | ICD-10-CM | POA: Diagnosis not present

## 2021-05-18 DIAGNOSIS — Z471 Aftercare following joint replacement surgery: Secondary | ICD-10-CM | POA: Diagnosis not present

## 2021-05-23 DIAGNOSIS — Z471 Aftercare following joint replacement surgery: Secondary | ICD-10-CM | POA: Diagnosis not present

## 2021-05-23 DIAGNOSIS — M1712 Unilateral primary osteoarthritis, left knee: Secondary | ICD-10-CM | POA: Diagnosis not present

## 2021-05-23 DIAGNOSIS — M25562 Pain in left knee: Secondary | ICD-10-CM | POA: Diagnosis not present

## 2021-05-23 DIAGNOSIS — R262 Difficulty in walking, not elsewhere classified: Secondary | ICD-10-CM | POA: Diagnosis not present

## 2021-05-25 DIAGNOSIS — R262 Difficulty in walking, not elsewhere classified: Secondary | ICD-10-CM | POA: Diagnosis not present

## 2021-05-25 DIAGNOSIS — Z471 Aftercare following joint replacement surgery: Secondary | ICD-10-CM | POA: Diagnosis not present

## 2021-05-25 DIAGNOSIS — M1712 Unilateral primary osteoarthritis, left knee: Secondary | ICD-10-CM | POA: Diagnosis not present

## 2021-05-25 DIAGNOSIS — M25562 Pain in left knee: Secondary | ICD-10-CM | POA: Diagnosis not present

## 2021-06-07 DIAGNOSIS — M25552 Pain in left hip: Secondary | ICD-10-CM | POA: Diagnosis not present

## 2021-06-07 DIAGNOSIS — M25562 Pain in left knee: Secondary | ICD-10-CM | POA: Diagnosis not present

## 2021-06-07 DIAGNOSIS — M545 Low back pain, unspecified: Secondary | ICD-10-CM | POA: Diagnosis not present

## 2021-06-12 DIAGNOSIS — M25552 Pain in left hip: Secondary | ICD-10-CM | POA: Diagnosis not present

## 2021-06-14 DIAGNOSIS — Z79891 Long term (current) use of opiate analgesic: Secondary | ICD-10-CM | POA: Diagnosis not present

## 2021-06-14 DIAGNOSIS — Z96652 Presence of left artificial knee joint: Secondary | ICD-10-CM | POA: Diagnosis not present

## 2021-06-14 DIAGNOSIS — M170 Bilateral primary osteoarthritis of knee: Secondary | ICD-10-CM | POA: Diagnosis not present

## 2021-06-14 DIAGNOSIS — M4727 Other spondylosis with radiculopathy, lumbosacral region: Secondary | ICD-10-CM | POA: Diagnosis not present

## 2021-06-14 DIAGNOSIS — G894 Chronic pain syndrome: Secondary | ICD-10-CM | POA: Diagnosis not present

## 2021-06-15 DIAGNOSIS — I10 Essential (primary) hypertension: Secondary | ICD-10-CM | POA: Diagnosis not present

## 2021-06-15 DIAGNOSIS — Z299 Encounter for prophylactic measures, unspecified: Secondary | ICD-10-CM | POA: Diagnosis not present

## 2021-06-15 DIAGNOSIS — Z86718 Personal history of other venous thrombosis and embolism: Secondary | ICD-10-CM | POA: Diagnosis not present

## 2021-06-21 ENCOUNTER — Encounter: Payer: Medicare HMO | Admitting: Vascular Surgery

## 2021-06-28 DIAGNOSIS — M25552 Pain in left hip: Secondary | ICD-10-CM | POA: Diagnosis not present

## 2021-07-06 DIAGNOSIS — Z96652 Presence of left artificial knee joint: Secondary | ICD-10-CM | POA: Diagnosis not present

## 2021-07-06 DIAGNOSIS — M170 Bilateral primary osteoarthritis of knee: Secondary | ICD-10-CM | POA: Diagnosis not present

## 2021-07-06 DIAGNOSIS — M4727 Other spondylosis with radiculopathy, lumbosacral region: Secondary | ICD-10-CM | POA: Diagnosis not present

## 2021-07-06 DIAGNOSIS — G894 Chronic pain syndrome: Secondary | ICD-10-CM | POA: Diagnosis not present

## 2021-07-06 DIAGNOSIS — Z79891 Long term (current) use of opiate analgesic: Secondary | ICD-10-CM | POA: Diagnosis not present

## 2021-08-02 DIAGNOSIS — Z96652 Presence of left artificial knee joint: Secondary | ICD-10-CM | POA: Diagnosis not present

## 2021-08-02 DIAGNOSIS — M25562 Pain in left knee: Secondary | ICD-10-CM | POA: Diagnosis not present

## 2021-08-02 DIAGNOSIS — G8929 Other chronic pain: Secondary | ICD-10-CM | POA: Diagnosis not present

## 2021-08-02 DIAGNOSIS — Z79891 Long term (current) use of opiate analgesic: Secondary | ICD-10-CM | POA: Diagnosis not present

## 2021-08-02 DIAGNOSIS — M4727 Other spondylosis with radiculopathy, lumbosacral region: Secondary | ICD-10-CM | POA: Diagnosis not present

## 2021-08-02 DIAGNOSIS — G894 Chronic pain syndrome: Secondary | ICD-10-CM | POA: Diagnosis not present

## 2021-08-02 DIAGNOSIS — M170 Bilateral primary osteoarthritis of knee: Secondary | ICD-10-CM | POA: Diagnosis not present

## 2021-08-08 DIAGNOSIS — I82409 Acute embolism and thrombosis of unspecified deep veins of unspecified lower extremity: Secondary | ICD-10-CM | POA: Diagnosis not present

## 2021-08-08 DIAGNOSIS — Z6825 Body mass index (BMI) 25.0-25.9, adult: Secondary | ICD-10-CM | POA: Diagnosis not present

## 2021-08-08 DIAGNOSIS — E876 Hypokalemia: Secondary | ICD-10-CM | POA: Diagnosis not present

## 2021-08-08 DIAGNOSIS — I10 Essential (primary) hypertension: Secondary | ICD-10-CM | POA: Diagnosis not present

## 2021-08-08 DIAGNOSIS — K219 Gastro-esophageal reflux disease without esophagitis: Secondary | ICD-10-CM | POA: Diagnosis not present

## 2021-08-30 DIAGNOSIS — M25562 Pain in left knee: Secondary | ICD-10-CM | POA: Diagnosis not present

## 2021-09-04 DIAGNOSIS — Z79891 Long term (current) use of opiate analgesic: Secondary | ICD-10-CM | POA: Diagnosis not present

## 2021-09-04 DIAGNOSIS — G8929 Other chronic pain: Secondary | ICD-10-CM | POA: Diagnosis not present

## 2021-09-04 DIAGNOSIS — M4727 Other spondylosis with radiculopathy, lumbosacral region: Secondary | ICD-10-CM | POA: Diagnosis not present

## 2021-09-04 DIAGNOSIS — Z96652 Presence of left artificial knee joint: Secondary | ICD-10-CM | POA: Diagnosis not present

## 2021-09-04 DIAGNOSIS — G894 Chronic pain syndrome: Secondary | ICD-10-CM | POA: Diagnosis not present

## 2021-09-04 DIAGNOSIS — M170 Bilateral primary osteoarthritis of knee: Secondary | ICD-10-CM | POA: Diagnosis not present

## 2021-09-04 DIAGNOSIS — M25562 Pain in left knee: Secondary | ICD-10-CM | POA: Diagnosis not present

## 2021-09-06 DIAGNOSIS — M4126 Other idiopathic scoliosis, lumbar region: Secondary | ICD-10-CM | POA: Diagnosis not present

## 2021-10-04 DIAGNOSIS — G894 Chronic pain syndrome: Secondary | ICD-10-CM | POA: Diagnosis not present

## 2021-10-04 DIAGNOSIS — M25562 Pain in left knee: Secondary | ICD-10-CM | POA: Diagnosis not present

## 2021-10-04 DIAGNOSIS — Z96652 Presence of left artificial knee joint: Secondary | ICD-10-CM | POA: Diagnosis not present

## 2021-10-04 DIAGNOSIS — Z79891 Long term (current) use of opiate analgesic: Secondary | ICD-10-CM | POA: Diagnosis not present

## 2021-10-04 DIAGNOSIS — M170 Bilateral primary osteoarthritis of knee: Secondary | ICD-10-CM | POA: Diagnosis not present

## 2021-10-04 DIAGNOSIS — G8929 Other chronic pain: Secondary | ICD-10-CM | POA: Diagnosis not present

## 2021-10-04 DIAGNOSIS — M4727 Other spondylosis with radiculopathy, lumbosacral region: Secondary | ICD-10-CM | POA: Diagnosis not present

## 2021-10-16 DIAGNOSIS — M25562 Pain in left knee: Secondary | ICD-10-CM | POA: Diagnosis not present

## 2021-10-20 DIAGNOSIS — R079 Chest pain, unspecified: Secondary | ICD-10-CM | POA: Diagnosis not present

## 2021-10-20 DIAGNOSIS — I82409 Acute embolism and thrombosis of unspecified deep veins of unspecified lower extremity: Secondary | ICD-10-CM | POA: Diagnosis not present

## 2021-10-20 DIAGNOSIS — Z886 Allergy status to analgesic agent status: Secondary | ICD-10-CM | POA: Diagnosis not present

## 2021-10-20 DIAGNOSIS — Z885 Allergy status to narcotic agent status: Secondary | ICD-10-CM | POA: Diagnosis not present

## 2021-10-20 DIAGNOSIS — M1612 Unilateral primary osteoarthritis, left hip: Secondary | ICD-10-CM | POA: Diagnosis not present

## 2021-10-20 DIAGNOSIS — Z743 Need for continuous supervision: Secondary | ICD-10-CM | POA: Diagnosis not present

## 2021-10-20 DIAGNOSIS — R52 Pain, unspecified: Secondary | ICD-10-CM | POA: Diagnosis not present

## 2021-10-20 DIAGNOSIS — Z9109 Other allergy status, other than to drugs and biological substances: Secondary | ICD-10-CM | POA: Diagnosis not present

## 2021-10-20 DIAGNOSIS — Z91041 Radiographic dye allergy status: Secondary | ICD-10-CM | POA: Diagnosis not present

## 2021-10-20 DIAGNOSIS — Z6824 Body mass index (BMI) 24.0-24.9, adult: Secondary | ICD-10-CM | POA: Diagnosis not present

## 2021-10-20 DIAGNOSIS — R58 Hemorrhage, not elsewhere classified: Secondary | ICD-10-CM | POA: Diagnosis not present

## 2021-10-20 DIAGNOSIS — R609 Edema, unspecified: Secondary | ICD-10-CM | POA: Diagnosis not present

## 2021-10-20 DIAGNOSIS — R2243 Localized swelling, mass and lump, lower limb, bilateral: Secondary | ICD-10-CM | POA: Diagnosis not present

## 2021-10-20 DIAGNOSIS — I10 Essential (primary) hypertension: Secondary | ICD-10-CM | POA: Diagnosis not present

## 2021-10-20 DIAGNOSIS — R600 Localized edema: Secondary | ICD-10-CM | POA: Diagnosis not present

## 2021-10-20 DIAGNOSIS — K219 Gastro-esophageal reflux disease without esophagitis: Secondary | ICD-10-CM | POA: Diagnosis not present

## 2021-10-21 DIAGNOSIS — R079 Chest pain, unspecified: Secondary | ICD-10-CM | POA: Diagnosis not present

## 2021-10-27 DIAGNOSIS — D6869 Other thrombophilia: Secondary | ICD-10-CM | POA: Diagnosis not present

## 2021-10-27 DIAGNOSIS — Z01818 Encounter for other preprocedural examination: Secondary | ICD-10-CM | POA: Diagnosis not present

## 2021-10-27 DIAGNOSIS — Z299 Encounter for prophylactic measures, unspecified: Secondary | ICD-10-CM | POA: Diagnosis not present

## 2021-10-27 DIAGNOSIS — I10 Essential (primary) hypertension: Secondary | ICD-10-CM | POA: Diagnosis not present

## 2021-10-27 DIAGNOSIS — M25552 Pain in left hip: Secondary | ICD-10-CM | POA: Diagnosis not present

## 2021-10-27 DIAGNOSIS — Z23 Encounter for immunization: Secondary | ICD-10-CM | POA: Diagnosis not present

## 2021-11-02 DIAGNOSIS — M1612 Unilateral primary osteoarthritis, left hip: Secondary | ICD-10-CM | POA: Diagnosis not present

## 2021-11-02 DIAGNOSIS — Z6824 Body mass index (BMI) 24.0-24.9, adult: Secondary | ICD-10-CM | POA: Diagnosis not present

## 2021-11-02 DIAGNOSIS — I10 Essential (primary) hypertension: Secondary | ICD-10-CM | POA: Diagnosis not present

## 2021-11-02 DIAGNOSIS — I82409 Acute embolism and thrombosis of unspecified deep veins of unspecified lower extremity: Secondary | ICD-10-CM | POA: Diagnosis not present

## 2021-11-02 DIAGNOSIS — K219 Gastro-esophageal reflux disease without esophagitis: Secondary | ICD-10-CM | POA: Diagnosis not present

## 2021-11-08 DIAGNOSIS — M25552 Pain in left hip: Secondary | ICD-10-CM | POA: Diagnosis not present

## 2021-11-13 NOTE — H&P (Signed)
HIP ARTHROPLASTY ADMISSION H&P  Patient ID: Deanna Wright MRN: [id number] DOB/AGE: 1940/12/31 81 y.o.  Chief Complaint: left hip pain.  Planned Procedure Date: 12/05/21 Medical and Cardiac Clearance by Eduard Clos NP   Pain management clearance by Legrand Como Roche PA-C   HPI: Deanna Wright is a 81 y.o. female who presents for evaluation of OA LEFT HIP. The patient has a history of pain and functional disability in the left hip due to arthritis and has failed non-surgical conservative treatments for greater than 12 weeks to include NSAID's and/or analgesics, corticosteriod injections, use of assistive devices, and activity modification.  Onset of symptoms was gradual, starting 1 year ago with gradually worsening course since that time. The patient noted no past surgery on the left hip.  Patient currently rates pain at 8 out of 10 with activity. Patient has night pain, worsening of pain with activity and weight bearing, and pain that interferes with activities of daily living.  Patient has evidence of subchondral sclerosis, periarticular osteophytes, joint space narrowing, and osteolysis  by imaging studies.  There is no active infection.  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   DJD (degenerative joint disease)    GERD (gastroesophageal reflux disease)    Heart murmur    Slight   History of colon polyps    Hypertension    OA (osteoarthritis)    Osteopenia    Scoliosis    Stomach ulcer    Past Surgical History:  Procedure Laterality Date   APPENDECTOMY     COLONOSCOPY     ENDOSCOPIC RETROGRADE CHOLANGIOPANCREATOGRAPHY (ERCP) WITH PROPOFOL N/A 01/17/2017   Procedure: ENDOSCOPIC RETROGRADE CHOLANGIOPANCREATOGRAPHY (ERCP) WITH PROPOFOL;  Surgeon: Carol Ada, MD;  Location: [facility];  Service: Endoscopy;  Laterality: N/A;   EUS N/A 01/01/2017   Procedure: UPPER ENDOSCOPIC  ULTRASOUND (EUS) LINEAR;  Surgeon: Carol Ada, MD;  Location: [facility] END[facility];  Service: Endoscopy;  Laterality: N/A;   KNEE SURGERY Right    TOTAL HIP ARTHROPLASTY Right 03/10/2019   Procedure: TOTAL HIP ARTHROPLASTY ANTERIOR APPROACH;  Surgeon: Renette Butters, MD;  Location: [facility] ORS;  Service: Orthopedics;  Laterality: Right;   TOTAL KNEE ARTHROPLASTY Left 03/21/2021   Procedure: TOTAL KNEE ARTHROPLASTY;  Surgeon: Renette Butters, MD;  Location: [facility] OR[facility];  Service: Orthopedics;  Laterality: Left;   Allergies  Allergen Reactions   Diflunisal    Other Other (See Comments)    MD advised to not take muscle relaxers   Stadol [Butorphanol] Nausea And Vomiting and Other (See Comments)    BP bottoms out   Prior to Admission medications   Medication Sig Start Date End Date Taking? Authorizing Provider  amLODipine (NORVASC) 10 MG tablet Take 10-15 mg by mouth every morning. Depends on blood pressure 10/14/18   [provider]  aspirin EC 81 MG tablet Take 1 tablet (81 mg total) by mouth 2 (two) times daily. For DVT prophylaxis for 30 days after surgery. 03/10/19   Prudencio Burly III, PA-C  aspirin EC 81 MG tablet Take 1 tablet (81 mg total) by mouth 2 (two) times daily. For DVT prophylaxis for 30 days after surgery. 03/22/21   Britt Bottom, PA-C  augmented betamethasone dipropionate (DIPROLENE-AF) 0.05 % ointment Apply 1 application topically 2 (two) times daily as needed. Patient not taking: Reported on 03/06/2021 02/17/19   [provider]  BIOTIN PO  Take 1 tablet by mouth 2 (two) times a week.    [provider]  Cholecalciferol (VITAMIN D3 PO) Take 1 capsule by mouth daily.    [provider]  docusate calcium (SURFAK) 240 MG capsule Take 240 mg by mouth as needed for mild constipation.    [provider]  HYDROcodone-acetaminophen (NORCO) 10-325 MG tablet Take 1 tablet by mouth every 6 (six) hours as needed for severe pain. 03/22/21   Britt Bottom, PA-C  MECLIZINE HCL PO Take 1 tablet by mouth as needed (Dizziness).    [provider]  metoprolol succinate (TOPROL-XL) 100 MG 24 hr tablet Take 100-150 mg by mouth every morning. Depends on the blood pressure 11/28/16   [provider]  omeprazole (PRILOSEC) 20 MG capsule Take 20 mg by mouth daily as needed (for heartburn/indigestion.). 3 times per week    [provider]  ondansetron (ZOFRAN) 4 MG tablet Take 1 tablet (4 mg total) by mouth every 8 (eight) hours as needed for nausea or vomiting. Patient not taking: Reported on 03/06/2021 03/10/19   Prudencio Burly III, PA-C  ondansetron (ZOFRAN-ODT) 4 MG disintegrating tablet Take 1 tablet (4 mg total) by mouth 2 (two) times daily as needed for nausea or vomiting. 03/22/21   Britt Bottom, PA-C  oxyCODONE-acetaminophen (PERCOCET) 10-325 MG tablet Take 1-2 tablets by mouth 3 (three) times daily. Additional of needed 12/15/20   [provider]   Social History   Socioeconomic History   Marital status: Married    Spouse name: Not on file   Number of children: Not on file   Years of education: Not on file   Highest education level: Not on file  Occupational History   Not on file  Tobacco Use   Smoking status: Never   Smokeless tobacco: Never  Vaping Use   Vaping Use: Never used  Substance and Sexual Activity   Alcohol use: No   Drug use: No   Sexual activity: Never    Birth control/protection: Post-menopausal  Other Topics Concern   Not on file  Social History Narrative   Not on file   Social Determinants of Health   Financial Resource Strain: Not on file  Food Insecurity: Not on file  Transportation Needs: Not on file  Physical Activity: Not on file  Stress: Not on file  Social Connections: Not on file   No family history on file.  ROS: Currently denies lightheadedness, dizziness, Fever, chills, CP, SOB.    No personal history of PE, MI, or CVA. 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  Objective: Vitals: Ht: 5'0" Wt: 123.4 lbs Temp: 97.9 BP: 200/80 Pulse: 53 O2 97% on room air.   Physical Exam: General: Alert, NAD. Trendelenberg Gait  HEENT: EOMI, Good Neck Extension  Pulm: No increased work of breathing.  Clear B/L A/P w/o crackle or wheeze.  CV: RRR, No m/g/r appreciated  GI: soft, NT, ND. BS x 4 quadrants Neuro: CN II-XII grossly intact without focal deficit.  Sensation intact distally Skin: No lesions in the area of chief complaint MSK/Surgical Site: + TTP. Hip ROM decreased d/t pain. + Stinchfield. + SLR. + FABER/FADIR. Decreased strength.  NVI.    Imaging Review Plain radiographs demonstrate severe degenerative joint disease of the left hip.   The  bone quality appears to be poor for age and reported activity level.  Preoperative templating of the joint replacement has been completed, documented, and submitted to the Operating Room personnel in order to optimize intra-operative equipment management.  Assessment: OA LEFT HIP Active Problems:   * No active hospital problems. *   Plan: Plan for Procedure(s): LEFT TOTAL HIP ARTHROPLASTY ANTERIOR APPROACH  The patient history, physical exam, clinical judgement of the provider and imaging are consistent with end stage degenerative joint disease and total joint arthroplasty is deemed medically necessary. The treatment options including medical management, injection therapy, and arthroplasty were discussed at length. The risks and benefits of Procedure(s): LEFT TOTAL HIP ARTHROPLASTY ANTERIOR APPROACH were presented and reviewed.  The risks of nonoperative treatment, versus surgical intervention including but not limited to continued pain, aseptic loosening, stiffness, dislocation/subluxation, infection, bleeding, nerve injury, blood clots,  cardiopulmonary complications, morbidity, mortality, among others were discussed. The patient verbalizes understanding and wishes to proceed with the plan.  Patient is being admitted for surgery, pain control, PT, prophylactic antibiotics, VTE prophylaxis, progressive ambulation, ADL's and discharge planning. She will spend the night in observation.  Dental prophylaxis discussed and recommended for 2 years postoperatively.  The patient does meet the criteria for TXA which will be used perioperatively.   Xarelto '10mg'$  bid will be used postoperatively for DVT prophylaxis in addition to SCDs, and early ambulation. Plan for Oxycodone '5mg'$  q6h on top of her normal daily Percocet for pain.    Zofran for nausea and vomiting. Colace for constipation prevention. Pharmacy- [facility] in [place] The patient is planning to be discharged home with OPPT and into the care of her nephew Calton Golds who can be reached at [phone] Follow up appt 12/20/21 at 4:15pm     Alisa Graff Office [phone] 11/13/2021 4:48 PM

## 2021-11-15 DIAGNOSIS — Z6824 Body mass index (BMI) 24.0-24.9, adult: Secondary | ICD-10-CM | POA: Diagnosis not present

## 2021-11-15 DIAGNOSIS — I10 Essential (primary) hypertension: Secondary | ICD-10-CM | POA: Diagnosis not present

## 2021-11-15 DIAGNOSIS — M1612 Unilateral primary osteoarthritis, left hip: Secondary | ICD-10-CM | POA: Diagnosis not present

## 2021-11-15 DIAGNOSIS — I82409 Acute embolism and thrombosis of unspecified deep veins of unspecified lower extremity: Secondary | ICD-10-CM | POA: Diagnosis not present

## 2021-11-15 DIAGNOSIS — E876 Hypokalemia: Secondary | ICD-10-CM | POA: Diagnosis not present

## 2021-11-15 DIAGNOSIS — K219 Gastro-esophageal reflux disease without esophagitis: Secondary | ICD-10-CM | POA: Diagnosis not present

## 2021-11-17 DIAGNOSIS — I10 Essential (primary) hypertension: Secondary | ICD-10-CM | POA: Diagnosis not present

## 2021-11-21 NOTE — Progress Notes (Signed)
COVID Vaccine Completed:  Date of COVID positive in last 90 days:  PCP - Levin Erp, MD Cardiologist -   Chest x-ray - 10/21/21 CE EKG - 03/09/21 Epic Stress Test -  ECHO -  Cardiac Cath -  Pacemaker/ICD device last checked: Spinal Cord Stimulator:  Bowel Prep -   Sleep Study -  CPAP -   Fasting Blood Sugar -  Checks Blood Sugar _____ times a day  Blood Thinner Instructions: Aspirin Instructions: Last Dose:  Activity level:  Can go up a flight of stairs and perform activities of daily living without stopping and without symptoms of chest pain or shortness of breath.  Able to exercise without symptoms  Unable to go up a flight of stairs without symptoms of     Anesthesia review: peripheral edema, HTN, DVT  Patient denies shortness of breath, fever, cough and chest pain at PAT appointment  Patient verbalized understanding of instructions that were given to them at the PAT appointment. Patient was also instructed that they will need to review over the PAT instructions again at home before surgery.

## 2021-11-21 NOTE — Patient Instructions (Signed)
SURGICAL WAITING ROOM VISITATION Patients having surgery or a procedure may have no more than 2 support people in the waiting area - these visitors may rotate.   Children under the age of 60 must have an adult with them who is not the patient. If the patient needs to stay at the hospital during part of their recovery, the visitor guidelines for inpatient rooms apply. Pre-op nurse will coordinate an appropriate time for 1 support person to accompany patient in pre-op.  This support person may not rotate.    Please refer to the [facility] website for the visitor guidelines for Inpatients (after your surgery is over and you are in a regular room).    Your procedure is scheduled on: 12/05/21   Report to [facility] Main Entrance    Report to admitting at 7:40 AM   Call this number if you have problems the morning of surgery [phone]   Do not eat food :After Midnight.   After Midnight you may have the following liquids until 7:00 AM DAY OF SURGERY  Water Non-Citrus Juices (without pulp, NO RED) Carbonated Beverages Black Coffee (NO MILK/CREAM OR CREAMERS, sugar ok)  Clear Tea (NO MILK/CREAM OR CREAMERS, sugar ok) regular and decaf                             Plain Jell-O (NO RED)                                           Fruit ices (not with fruit pulp, NO RED)                                     Popsicles (NO RED)                                                               Sports drinks like Gatorade (NO RED)                 The day of surgery:  Drink ONE (1) Pre-Surgery Clear Ensure at 7:00 AM the morning of surgery. Drink in one sitting. Do not sip.  This drink was given to you during your hospital  pre-op appointment visit. Nothing else to drink after completing the  Pre-Surgery Clear Ensure.          If you have questions, please contact your surgeon's office.   FOLLOW BOWEL PREP AND ANY ADDITIONAL PRE OP INSTRUCTIONS YOU RECEIVED FROM YOUR SURGEON'S OFFICE!!!      Oral Hygiene is also important to reduce your risk of infection.                                    Remember - BRUSH YOUR TEETH THE MORNING OF SURGERY WITH YOUR REGULAR TOOTHPASTE   Take these medicines the morning of surgery with A SIP OF WATER: Metoprolol, Omeprazole, Percocet  You may not have any metal on your body including hair pins, jewelry, and body piercing             Do not wear make-up, lotions, powders, perfumes, or deodorant  Do not wear nail polish including gel and S&S, artificial/acrylic nails, or any other type of covering on natural nails including finger and toenails. If you have artificial nails, gel coating, etc. that needs to be removed by a nail salon please have this removed prior to surgery or surgery may need to be canceled/ delayed if the surgeon/ anesthesia feels like they are unable to be safely monitored.   Do not shave  48 hours prior to surgery.    Do not bring valuables to the hospital. [place].   Bring small overnight bag day of surgery.   DO NOT [place]. PHARMACY WILL DISPENSE MEDICATIONS LISTED ON YOUR MEDICATION LIST TO YOU DURING YOUR ADMISSION [place]!   Special Instructions: Bring a copy of your healthcare power of attorney and living will documents the day of surgery if you haven't scanned them before.              Please read over the following fact sheets you were given: IF [place] [phone] Schneiders   If you received a COVID test during your pre-op visit  it is requested that you wear a mask when out in public, stay away from anyone that may not be feeling well and notify your surgeon if you develop symptoms. If you test positive for Covid or have been in contact with anyone that has tested positive in the last 10 days please notify you surgeon.     [place] -  Preparing for Surgery Before surgery, you can play an important role.  Because skin is not sterile, your skin needs to be as free of germs as possible.  You can reduce the number of germs on your skin by washing with CHG (chlorahexidine gluconate) soap before surgery.  CHG is an antiseptic cleaner which kills germs and bonds with the skin to continue killing germs even after washing. Please DO NOT use if you have an allergy to CHG or antibacterial soaps.  If your skin becomes reddened/irritated stop using the CHG and inform your nurse when you arrive at Short Stay. Do not shave (including legs and underarms) for at least 48 hours prior to the first CHG shower.  You may shave your face/neck.  Please follow these instructions carefully:  1.  Shower with CHG Soap the night before surgery and the  morning of surgery.  2.  If you choose to wash your hair, wash your hair first as usual with your normal  shampoo.  3.  After you shampoo, rinse your hair and body thoroughly to remove the shampoo.                             4.  Use CHG as you would any other liquid soap.  You can apply chg directly to the skin and wash.  Gently with a scrungie or clean washcloth.  5.  Apply the CHG Soap to your body ONLY FROM THE NECK DOWN.   Do   not use on face/ open  Wound or open sores. Avoid contact with eyes, ears mouth and   genitals (private parts).                       Wash face,  Genitals (private parts) with your normal soap.             6.  Wash thoroughly, paying special attention to the area where your    surgery  will be performed.  7.  Thoroughly rinse your body with warm water from the neck down.  8.  DO NOT shower/wash with your normal soap after using and rinsing off the CHG Soap.                9.  Pat yourself dry with a clean towel.            10.  Wear clean pajamas.            11.  Place clean sheets on your bed the night of your first shower and do not  sleep with  pets. Day of Surgery : Do not apply any lotions/deodorants the morning of surgery.  Please wear clean clothes to the hospital/surgery center.  FAILURE TO FOLLOW THESE INSTRUCTIONS MAY RESULT IN THE CANCELLATION OF YOUR SURGERY  PATIENT SIGNATURE_________________________________  NURSE SIGNATURE__________________________________  ________________________________________________________________________   Adam Phenix  An incentive spirometer is a tool that can help keep your lungs clear and active. This tool measures how well you are filling your lungs with each breath. Taking long deep breaths may help reverse or decrease the chance of developing breathing (pulmonary) problems (especially infection) following: A long period of time when you are unable to move or be active. BEFORE THE PROCEDURE  If the spirometer includes an indicator to show your best effort, your nurse or respiratory therapist will set it to a desired goal. If possible, sit up straight or lean slightly forward. Try not to slouch. Hold the incentive spirometer in an upright position. INSTRUCTIONS FOR USE  Sit on the edge of your bed if possible, or sit up as far as you can in bed or on a chair. Hold the incentive spirometer in an upright position. Breathe out normally. Place the mouthpiece in your mouth and seal your lips tightly around it. Breathe in slowly and as deeply as possible, raising the piston or the ball toward the top of the column. Hold your breath for 3-5 seconds or for as long as possible. Allow the piston or ball to fall to the bottom of the column. Remove the mouthpiece from your mouth and breathe out normally. Rest for a few seconds and repeat Steps 1 through 7 at least 10 times every 1-2 hours when you are awake. Take your time and take a few normal breaths between deep breaths. The spirometer may include an indicator to show your best effort. Use the indicator as a goal to work toward during  each repetition. After each set of 10 deep breaths, practice coughing to be sure your lungs are clear. If you have an incision (the cut made at the time of surgery), support your incision when coughing by placing a pillow or rolled up towels firmly against it. Once you are able to get out of bed, walk around indoors and cough well. You may stop using the incentive spirometer when instructed by your caregiver.  RISKS AND COMPLICATIONS Take your time so you do not get dizzy or light-headed. If you are in pain, you  may need to take or ask for pain medication before doing incentive spirometry. It is harder to take a deep breath if you are having pain. AFTER USE Rest and breathe slowly and easily. It can be helpful to keep track of a log of your progress. Your caregiver can provide you with a simple table to help with this. If you are using the spirometer at home, follow these instructions: [place] IF:  You are having difficultly using the spirometer. You have trouble using the spirometer as often as instructed. Your pain medication is not giving enough relief while using the spirometer. You develop fever of 100.5 F (38.1 C) or higher. SEEK IMMEDIATE MEDICAL CARE IF:  You cough up bloody sputum that had not been present before. You develop fever of 102 F (38.9 C) or greater. You develop worsening pain at or near the incision site. MAKE SURE YOU:  Understand these instructions. Will watch your condition. Will get help right away if you are not doing well or get worse. Document Released: 05/28/2006 Document Revised: 04/09/2011 Document Reviewed: 07/29/2006 [employer] Patient Information 2014 [employer], [place].   ________________________________________________________________________  WHAT IS A BLOOD TRANSFUSION? Blood Transfusion Information  A transfusion is the replacement of blood or some of its parts. Blood is made up of multiple cells which provide different functions. Red  blood cells carry oxygen and are used for blood loss replacement. White blood cells fight against infection. Platelets control bleeding. Plasma helps clot blood. Other blood products are available for specialized needs, such as hemophilia or other clotting disorders. BEFORE THE TRANSFUSION  Who gives blood for transfusions?  Healthy volunteers who are fully evaluated to make sure their blood is safe. This is blood bank blood. Transfusion therapy is the safest it has ever been in the practice of medicine. Before blood is taken from a donor, a complete history is taken to make sure that person has no history of diseases nor engages in risky social behavior (examples are intravenous drug use or sexual activity with multiple partners). The donor's travel history is screened to minimize risk of transmitting infections, such as malaria. The donated blood is tested for signs of infectious diseases, such as HIV and hepatitis. The blood is then tested to be sure it is compatible with you in order to minimize the chance of a transfusion reaction. If you or a relative donates blood, this is often done in anticipation of surgery and is not appropriate for emergency situations. It takes many days to process the donated blood. RISKS AND COMPLICATIONS Although transfusion therapy is very safe and saves many lives, the main dangers of transfusion include:  Getting an infectious disease. Developing a transfusion reaction. This is an allergic reaction to something in the blood you were given. Every precaution is taken to prevent this. The decision to have a blood transfusion has been considered carefully by your caregiver before blood is given. Blood is not given unless the benefits outweigh the risks. AFTER THE TRANSFUSION Right after receiving a blood transfusion, you will usually feel much better and more energetic. This is especially true if your red blood cells have gotten low (anemic). The transfusion raises the  level of the red blood cells which carry oxygen, and this usually causes an energy increase. The nurse administering the transfusion will monitor you carefully for complications. HOME CARE INSTRUCTIONS  No special instructions are needed after a transfusion. You may find your energy is better. Speak with your caregiver about any limitations on  activity for underlying diseases you may have. SEEK MEDICAL CARE IF:  Your condition is not improving after your transfusion. You develop redness or irritation at the intravenous (IV) site. SEEK IMMEDIATE MEDICAL CARE IF:  Any of the following symptoms occur over the next 12 hours: Shaking chills. You have a temperature by mouth above 102 F (38.9 C), not controlled by medicine. Chest, back, or muscle pain. People around you feel you are not acting correctly or are confused. Shortness of breath or difficulty breathing. Dizziness and fainting. You get a rash or develop hives. You have a decrease in urine output. Your urine turns a dark color or changes to pink, red, or brown. Any of the following symptoms occur over the next 10 days: You have a temperature by mouth above 102 F (38.9 C), not controlled by medicine. Shortness of breath. Weakness after normal activity. The white part of the eye turns yellow (jaundice). You have a decrease in the amount of urine or are urinating less often. Your urine turns a dark color or changes to pink, red, or brown. Document Released: 01/13/2000 Document Revised: 04/09/2011 Document Reviewed: 09/01/2007 [facility] Patient Information 2014 [place], [place].  _______________________________________________________________________

## 2021-11-22 ENCOUNTER — Encounter (HOSPITAL_COMMUNITY): Payer: Self-pay

## 2021-11-22 ENCOUNTER — Encounter (HOSPITAL_COMMUNITY)
Admission: RE | Admit: 2021-11-22 | Discharge: 2021-11-22 | Disposition: A | Discharge status: Home / Self Care | Payer: Medicare HMO | Source: Ambulatory Visit | Attending: Orthopedic Surgery | Admitting: Orthopedic Surgery

## 2021-11-22 VITALS — BP 150/68 | HR 59 | Temp 98.4°F | Resp 14 | Ht 60.0 in | Wt 125.0 lb

## 2021-11-22 DIAGNOSIS — Z01812 Encounter for preprocedural laboratory examination: Secondary | ICD-10-CM | POA: Insufficient documentation

## 2021-11-22 DIAGNOSIS — Z01818 Encounter for other preprocedural examination: Secondary | ICD-10-CM

## 2021-11-22 DIAGNOSIS — I10 Essential (primary) hypertension: Secondary | ICD-10-CM | POA: Diagnosis not present

## 2021-11-22 HISTORY — DX: Acute embolism and thrombosis of unspecified deep veins of unspecified lower extremity: I82.409

## 2021-11-22 HISTORY — DX: Other complications of anesthesia, initial encounter: T88.59XA

## 2021-11-22 LAB — SURGICAL PCR SCREEN
MRSA, PCR: NEGATIVE
Staphylococcus aureus: NEGATIVE

## 2021-11-22 LAB — TYPE AND SCREEN
ABO/RH(D): A POS
Antibody Screen: NEGATIVE

## 2021-11-22 LAB — CBC
HCT: 33.8 % — ABNORMAL LOW (ref 36.0–46.0)
Hemoglobin: 10.4 g/dL — ABNORMAL LOW (ref 12.0–15.0)
MCH: 25.3 pg — ABNORMAL LOW (ref 26.0–34.0)
MCHC: 30.8 g/dL (ref 30.0–36.0)
MCV: 82.2 fL (ref 80.0–100.0)
Platelets: 246 10*3/uL (ref 150–400)
RBC: 4.11 MIL/uL (ref 3.87–5.11)
RDW: 14.5 % (ref 11.5–15.5)
WBC: 7 10*3/uL (ref 4.0–10.5)
nRBC: 0 % (ref 0.0–0.2)

## 2021-11-22 LAB — BASIC METABOLIC PANEL
Anion gap: 10 (ref 5–15)
BUN: 26 mg/dL — ABNORMAL HIGH (ref 8–23)
CO2: 24 mmol/L (ref 22–32)
Calcium: 9.2 mg/dL (ref 8.9–10.3)
Chloride: 106 mmol/L (ref 98–111)
Creatinine, Ser: 0.94 mg/dL (ref 0.44–1.00)
GFR, Estimated: >60 mL/min (ref >60)
Glucose, Bld: 106 mg/dL — ABNORMAL HIGH (ref 70–99)
Potassium: 3.6 mmol/L (ref 3.5–5.1)
Sodium: 140 mmol/L (ref 135–145)

## 2021-12-04 NOTE — Discharge Instructions (Signed)
You may bear weight as tolerated. Keep your dressing on and dry until follow up. Take medicine to prevent blood clots as directed. Take pain medicine as needed with the goal of transitioning to over the counter medicines.    INSTRUCTIONS AFTER JOINT REPLACEMENT   Remove items at home which could result in a fall. This includes throw rugs or furniture in walking pathways ICE to the affected joint every three hours while awake for 30 minutes at a time, for at least the first 3-5 days, and then as needed for pain and swelling.  Continue to use ice for pain and swelling. You may notice swelling that will progress down to the foot and ankle.  This is normal after surgery.  Elevate your leg when you are not up walking on it.   Continue to use the breathing machine you got in the hospital (incentive spirometer) which will help keep your temperature down.  It is common for your temperature to cycle up and down following surgery, especially at night when you are not up moving around and exerting yourself.  The breathing machine keeps your lungs expanded and your temperature down.   DIET:  As you were doing prior to hospitalization, we recommend a well-balanced diet.  DRESSING / WOUND CARE / SHOWERING  You may shower 3 days after surgery, but keep the wounds dry during showering.  You may use an occlusive plastic wrap (Press'n Seal for example) with blue painter's tape at edges, NO SOAKING/SUBMERGING IN THE BATHTUB.  If the bandage gets wet, call the office.   ACTIVITY  Increase activity slowly as tolerated, but follow the weight bearing instructions below.   No driving for 6 weeks or until further direction given by your physician.  You cannot drive while taking narcotics.  No lifting or carrying greater than 10 lbs. until further directed by your surgeon. Avoid periods of inactivity such as sitting longer than an hour when not asleep. This helps prevent blood clots.  You may return to work once you  are authorized by your doctor.    WEIGHT BEARING   Weight bearing as tolerated with assist device (walker, cane, etc) as directed, use it as long as suggested by your surgeon or therapist, typically at least 4-6 weeks.   EXERCISES  Results after joint replacement surgery are often greatly improved when you follow the exercise, range of motion and muscle strengthening exercises prescribed by your doctor. Safety measures are also important to protect the joint from further injury. Any time any of these exercises cause you to have increased pain or swelling, decrease what you are doing until you are comfortable again and then slowly increase them. If you have problems or questions, call your caregiver or physical therapist for advice.   Rehabilitation is important following a joint replacement. After just a few days of immobilization, the muscles of the leg can become weakened and shrink (atrophy).  These exercises are designed to build up the tone and strength of the thigh and leg muscles and to improve motion. Often times heat used for twenty to thirty minutes before working out will loosen up your tissues and help with improving the range of motion but do not use heat for the first two weeks following surgery (sometimes heat can increase post-operative swelling).   These exercises can be done on a training (exercise) mat, on the floor, on a table or on a bed. Use whatever works the best and is most comfortable for you.  Use music or television while you are exercising so that the exercises are a pleasant break in your day. This will make your life better with the exercises acting as a break in your routine that you can look forward to.   Perform all exercises about fifteen times, three times per day or as directed.  You should exercise both the operative leg and the other leg as well.  Exercises include:   Quad Sets - Tighten up the muscle on the front of the thigh (Quad) and hold for 5-10  seconds.   Straight Leg Raises - With your knee straight (if you were given a brace, keep it on), lift the leg to 60 degrees, hold for 3 seconds, and slowly lower the leg.  Perform this exercise against resistance later as your leg gets stronger.  Leg Slides: Lying on your back, slowly slide your foot toward your buttocks, bending your knee up off the floor (only go as far as is comfortable). Then slowly slide your foot back down until your leg is flat on the floor again.  Angel Wings: Lying on your back spread your legs to the side as far apart as you can without causing discomfort.  Hamstring Strength:  Lying on your back, push your heel against the floor with your leg straight by tightening up the muscles of your buttocks.  Repeat, but this time bend your knee to a comfortable angle, and push your heel against the floor.  You may put a pillow under the heel to make it more comfortable if necessary.   A rehabilitation program following joint replacement surgery can speed recovery and prevent re-injury in the future due to weakened muscles. Contact your doctor or a physical therapist for more information on knee rehabilitation.    CONSTIPATION  Constipation is defined medically as fewer than three stools per week and severe constipation as less than one stool per week.  Even if you have a regular bowel pattern at home, your normal regimen is likely to be disrupted due to multiple reasons following surgery.  Combination of anesthesia, postoperative narcotics, change in appetite and fluid intake all can affect your bowels.   YOU MUST use at least one of the following options; they are listed in order of increasing strength to get the job done.  They are all available over the counter, and you may need to use some, POSSIBLY even all of these options:    Drink plenty of fluids (prune juice may be helpful) and high fiber foods Colace 100 mg by mouth twice a day  Senokot for constipation as directed and  as needed Dulcolax (bisacodyl), take with full glass of water  Miralax (polyethylene glycol) once or twice a day as needed.  If you have tried all these things and are unable to have a bowel movement in the first 3-4 days after surgery call either your surgeon or your primary doctor.    If you experience loose stools or diarrhea, hold the medications until you stool forms back up.  If your symptoms do not get better within 1 week or if they get worse, check with your doctor.  If you experience "the worst abdominal pain ever" or develop nausea or vomiting, please contact the office immediately for further recommendations for treatment.   ITCHING:  If you experience itching with your medications, try taking only a single pain pill, or even half a pain pill at a time.  You can also use Benadryl over the counter  for itching or also to help with sleep.   TED HOSE STOCKINGS:  Use stockings on both legs until for at least 2 weeks or as directed by physician office. They may be removed at night for sleeping.  MEDICATIONS:  See your medication summary on the "After Visit Summary" that nursing will review with you.  You may have some home medications which will be placed on hold until you complete the course of blood thinner medication.  It is important for you to complete the blood thinner medication as prescribed.  Take medicines as prescribed.   You have several different medicines that work in different ways. - Tylenol is for mild to moderate pain. Try to take this medicine before turning to your narcotic medicines.  - Oxycodone is a narcotic pain medicine.  Take this for severe pain. This medicine can be dehydrating / constipating.  - Take for severe pain that is not controlled by your daily pain prescription - Zofran is for nausea and vomiting. - Colace is for constipation prevention while taking narcotics - Xarelto is to prevent blood clots after surgery.  - Prednisone this is to help with the  shooting pain your are experiencing down your leg/radicular pain  PRECAUTIONS:  If you experience chest pain or shortness of breath - call 911 immediately for transfer to the hospital emergency department.   If you develop a fever greater that 101 F, purulent drainage from wound, increased redness or drainage from wound, foul odor from the wound/dressing, or calf pain - CONTACT YOUR SURGEON.                                                   FOLLOW-UP APPOINTMENTS:  If you do not already have a post-op appointment, please call the office [phone] for an appointment to be seen by Dr. Percell Miller in 2 weeks.   OTHER INSTRUCTIONS:   MAKE SURE YOU:  Understand these instructions.  Get help right away if you are not doing well or get worse.    Thank you for letting us be a part of your medical care team.  It is a privilege we respect greatly.  We hope these instructions will help you stay on track for a fast and full recovery!

## 2021-12-05 ENCOUNTER — Inpatient Hospital Stay (HOSPITAL_COMMUNITY)
Admission: AD | Admit: 2021-12-05 | Discharge: 2021-12-07 | DRG: 470 | Disposition: A | Discharge status: Home Health | Payer: Medicare HMO | Source: Ambulatory Visit | Attending: Orthopedic Surgery | Admitting: Orthopedic Surgery

## 2021-12-05 ENCOUNTER — Encounter (HOSPITAL_COMMUNITY)
Admission: AD | Disposition: A | Discharge status: Home Health | Payer: Self-pay | Source: Ambulatory Visit | Attending: Orthopedic Surgery

## 2021-12-05 ENCOUNTER — Other Ambulatory Visit: Payer: Self-pay

## 2021-12-05 ENCOUNTER — Ambulatory Visit (HOSPITAL_COMMUNITY): Payer: Medicare HMO | Admitting: Physician Assistant

## 2021-12-05 ENCOUNTER — Ambulatory Visit (HOSPITAL_COMMUNITY): Payer: Medicare HMO

## 2021-12-05 ENCOUNTER — Ambulatory Visit (HOSPITAL_BASED_OUTPATIENT_CLINIC_OR_DEPARTMENT_OTHER): Payer: Medicare HMO | Admitting: Certified Registered Nurse Anesthetist

## 2021-12-05 ENCOUNTER — Encounter (HOSPITAL_COMMUNITY): Payer: Self-pay | Admitting: Orthopedic Surgery

## 2021-12-05 DIAGNOSIS — M1612 Unilateral primary osteoarthritis, left hip: Secondary | ICD-10-CM | POA: Diagnosis not present

## 2021-12-05 DIAGNOSIS — Z471 Aftercare following joint replacement surgery: Secondary | ICD-10-CM | POA: Diagnosis not present

## 2021-12-05 DIAGNOSIS — Z96641 Presence of right artificial hip joint: Secondary | ICD-10-CM | POA: Diagnosis present

## 2021-12-05 DIAGNOSIS — Z79899 Other long term (current) drug therapy: Secondary | ICD-10-CM

## 2021-12-05 DIAGNOSIS — Z96652 Presence of left artificial knee joint: Secondary | ICD-10-CM | POA: Diagnosis present

## 2021-12-05 DIAGNOSIS — Z96642 Presence of left artificial hip joint: Secondary | ICD-10-CM | POA: Diagnosis not present

## 2021-12-05 DIAGNOSIS — K219 Gastro-esophageal reflux disease without esophagitis: Secondary | ICD-10-CM | POA: Diagnosis present

## 2021-12-05 DIAGNOSIS — Z888 Allergy status to other drugs, medicaments and biological substances status: Secondary | ICD-10-CM

## 2021-12-05 DIAGNOSIS — Z86718 Personal history of other venous thrombosis and embolism: Secondary | ICD-10-CM

## 2021-12-05 DIAGNOSIS — I10 Essential (primary) hypertension: Secondary | ICD-10-CM | POA: Diagnosis present

## 2021-12-05 DIAGNOSIS — Z7982 Long term (current) use of aspirin: Secondary | ICD-10-CM

## 2021-12-05 HISTORY — PX: TOTAL HIP ARTHROPLASTY: SHX124

## 2021-12-05 LAB — CBC
HCT: 34.3 % — ABNORMAL LOW (ref 36.0–46.0)
Hemoglobin: 10.5 g/dL — ABNORMAL LOW (ref 12.0–15.0)
MCH: 25.2 pg — ABNORMAL LOW (ref 26.0–34.0)
MCHC: 30.6 g/dL (ref 30.0–36.0)
MCV: 82.5 fL (ref 80.0–100.0)
Platelets: 273 10*3/uL (ref 150–400)
RBC: 4.16 MIL/uL (ref 3.87–5.11)
RDW: 14.2 % (ref 11.5–15.5)
WBC: 15.8 10*3/uL — ABNORMAL HIGH (ref 4.0–10.5)
nRBC: 0 % (ref 0.0–0.2)

## 2021-12-05 LAB — CREATININE, SERUM
Creatinine, Ser: 0.9 mg/dL (ref 0.44–1.00)
GFR, Estimated: >60 mL/min (ref >60)

## 2021-12-05 LAB — ABO/RH: ABO/RH(D): A POS

## 2021-12-05 SURGERY — ARTHROPLASTY, HIP, TOTAL, ANTERIOR APPROACH
Anesthesia: Spinal | Site: Hip | Laterality: Left

## 2021-12-05 MED ORDER — BUPIVACAINE LIPOSOME 1.3 % IJ SUSP
INTRAMUSCULAR | Status: AC
  Filled 2021-12-05: qty 10

## 2021-12-05 MED ORDER — HYDROMORPHONE HCL 1 MG/ML IJ SOLN
0.5000 mg | INTRAMUSCULAR | Status: DC | PRN
  Administered 2021-12-05: 1 mg via INTRAVENOUS
  Administered 2021-12-05: 0.5 mg via INTRAVENOUS
  Filled 2021-12-05 (×2): qty 1

## 2021-12-05 MED ORDER — METOCLOPRAMIDE HCL 5 MG PO TABS: 5.0000 mg | ORAL_TABLET | Freq: Three times a day (TID) | ORAL | Status: DC | PRN

## 2021-12-05 MED ORDER — CHLORHEXIDINE GLUCONATE 0.12 % MT SOLN
15.0000 mL | Freq: Once | OROMUCOSAL | Status: AC
  Administered 2021-12-05: 15 mL via OROMUCOSAL

## 2021-12-05 MED ORDER — PANTOPRAZOLE SODIUM 40 MG PO TBEC
40.0000 mg | DELAYED_RELEASE_TABLET | Freq: Every day | ORAL | Status: DC
  Administered 2021-12-06 – 2021-12-07 (×2): 40 mg via ORAL
  Filled 2021-12-05 (×2): qty 1

## 2021-12-05 MED ORDER — BUPIVACAINE IN DEXTROSE 0.75-8.25 % IT SOLN
INTRATHECAL | Status: DC | PRN
  Administered 2021-12-05: 1.6 mL via INTRATHECAL

## 2021-12-05 MED ORDER — PROPOFOL 10 MG/ML IV BOLUS
INTRAVENOUS | Status: DC | PRN
  Administered 2021-12-05 (×2): 20 mg via INTRAVENOUS

## 2021-12-05 MED ORDER — SODIUM CHLORIDE FLUSH 0.9 % IV SOLN
INTRAVENOUS | Status: DC | PRN
  Administered 2021-12-05: 10 mL

## 2021-12-05 MED ORDER — POTASSIUM CHLORIDE IN NACL 20-0.45 MEQ/L-% IV SOLN
INTRAVENOUS | Status: DC
  Filled 2021-12-05: qty 1000

## 2021-12-05 MED ORDER — ONDANSETRON HCL 4 MG/2ML IJ SOLN: 4.0000 mg | Freq: Once | INTRAMUSCULAR | Status: DC | PRN

## 2021-12-05 MED ORDER — DEXAMETHASONE SODIUM PHOSPHATE 10 MG/ML IJ SOLN: 10.0000 mg | Freq: Once | INTRAMUSCULAR | Status: DC

## 2021-12-05 MED ORDER — ORAL CARE MOUTH RINSE: 15.0000 mL | Freq: Once | OROMUCOSAL | Status: AC

## 2021-12-05 MED ORDER — ONDANSETRON HCL 4 MG PO TABS: 4.0000 mg | ORAL_TABLET | Freq: Three times a day (TID) | ORAL | 0 refills | Status: AC | PRN

## 2021-12-05 MED ORDER — CEFAZOLIN SODIUM-DEXTROSE 1-4 GM/50ML-% IV SOLN
1.0000 g | Freq: Four times a day (QID) | INTRAVENOUS | Status: AC
  Administered 2021-12-05 (×2): 1 g via INTRAVENOUS
  Filled 2021-12-05 (×2): qty 50

## 2021-12-05 MED ORDER — BENAZEPRIL HCL 20 MG PO TABS
40.0000 mg | ORAL_TABLET | Freq: Every day | ORAL | Status: DC
  Administered 2021-12-06 – 2021-12-07 (×2): 40 mg via ORAL
  Filled 2021-12-05 (×2): qty 2

## 2021-12-05 MED ORDER — LACTATED RINGERS IV BOLUS: 500.0000 mL | Freq: Once | INTRAVENOUS | Status: DC

## 2021-12-05 MED ORDER — POVIDONE-IODINE 10 % EX SWAB: 2.0000 | Freq: Once | CUTANEOUS | Status: DC

## 2021-12-05 MED ORDER — DEXAMETHASONE SODIUM PHOSPHATE 10 MG/ML IJ SOLN
INTRAMUSCULAR | Status: AC
  Filled 2021-12-05: qty 1

## 2021-12-05 MED ORDER — METHOCARBAMOL 500 MG PO TABS: 500.0000 mg | ORAL_TABLET | Freq: Four times a day (QID) | ORAL | Status: DC | PRN

## 2021-12-05 MED ORDER — FENTANYL CITRATE PF 50 MCG/ML IJ SOSY: 25.0000 ug | PREFILLED_SYRINGE | INTRAMUSCULAR | Status: DC | PRN

## 2021-12-05 MED ORDER — SODIUM CHLORIDE (PF) 0.9 % IJ SOLN
INTRAMUSCULAR | Status: AC
  Filled 2021-12-05: qty 10

## 2021-12-05 MED ORDER — ONDANSETRON HCL 4 MG PO TABS: 4.0000 mg | ORAL_TABLET | Freq: Four times a day (QID) | ORAL | Status: DC | PRN

## 2021-12-05 MED ORDER — POLYETHYLENE GLYCOL 3350 17 G PO PACK: 17.0000 g | PACK | Freq: Every day | ORAL | Status: DC | PRN

## 2021-12-05 MED ORDER — METOCLOPRAMIDE HCL 5 MG/ML IJ SOLN: 5.0000 mg | Freq: Three times a day (TID) | INTRAMUSCULAR | Status: DC | PRN

## 2021-12-05 MED ORDER — WATER FOR IRRIGATION, STERILE IR SOLN
Status: DC | PRN
  Administered 2021-12-05: 2000 mL

## 2021-12-05 MED ORDER — BUPIVACAINE LIPOSOME 1.3 % IJ SUSP
INTRAMUSCULAR | Status: DC | PRN
  Administered 2021-12-05: 10 mL

## 2021-12-05 MED ORDER — DOCUSATE SODIUM 100 MG PO CAPS: 100.0000 mg | ORAL_CAPSULE | Freq: Two times a day (BID) | ORAL | 0 refills | Status: AC

## 2021-12-05 MED ORDER — BUPIVACAINE LIPOSOME 1.3 % IJ SUSP: 10.0000 mL | Freq: Once | INTRAMUSCULAR | Status: DC

## 2021-12-05 MED ORDER — POVIDONE-IODINE 10 % EX SWAB
2.0000 | Freq: Once | CUTANEOUS | Status: AC
  Administered 2021-12-05: 2 via TOPICAL

## 2021-12-05 MED ORDER — HYDROCHLOROTHIAZIDE 25 MG PO TABS: 25.0000 mg | ORAL_TABLET | Freq: Every day | ORAL | Status: DC | PRN

## 2021-12-05 MED ORDER — PROPOFOL 500 MG/50ML IV EMUL
INTRAVENOUS | Status: DC | PRN
  Administered 2021-12-05: 40 ug/kg/min via INTRAVENOUS

## 2021-12-05 MED ORDER — ACETAMINOPHEN 500 MG PO TABS
1000.0000 mg | ORAL_TABLET | Freq: Four times a day (QID) | ORAL | Status: AC
  Filled 2021-12-05: qty 2

## 2021-12-05 MED ORDER — RIVAROXABAN 10 MG PO TABS: 10.0000 mg | ORAL_TABLET | Freq: Every day | ORAL | 0 refills | Status: AC

## 2021-12-05 MED ORDER — OXYCODONE HCL 5 MG/5ML PO SOLN: 5.0000 mg | Freq: Once | ORAL | Status: DC | PRN

## 2021-12-05 MED ORDER — MENTHOL 3 MG MT LOZG: 1.0000 | LOZENGE | OROMUCOSAL | Status: DC | PRN

## 2021-12-05 MED ORDER — LACTATED RINGERS IV BOLUS: 250.0000 mL | Freq: Once | INTRAVENOUS | Status: DC

## 2021-12-05 MED ORDER — BISACODYL 10 MG RE SUPP: 10.0000 mg | Freq: Every day | RECTAL | Status: DC | PRN

## 2021-12-05 MED ORDER — ONDANSETRON HCL 4 MG/2ML IJ SOLN: 4.0000 mg | Freq: Four times a day (QID) | INTRAMUSCULAR | Status: DC | PRN

## 2021-12-05 MED ORDER — OXYCODONE HCL 5 MG PO TABS
10.0000 mg | ORAL_TABLET | ORAL | Status: DC | PRN
  Administered 2021-12-05: 10 mg via ORAL
  Administered 2021-12-05 – 2021-12-07 (×8): 15 mg via ORAL
  Filled 2021-12-05 (×5): qty 3
  Filled 2021-12-05: qty 2
  Filled 2021-12-05 (×3): qty 3

## 2021-12-05 MED ORDER — TRANEXAMIC ACID-NACL 1000-0.7 MG/100ML-% IV SOLN
1000.0000 mg | INTRAVENOUS | Status: AC
  Administered 2021-12-05: 1000 mg via INTRAVENOUS
  Filled 2021-12-05: qty 100

## 2021-12-05 MED ORDER — DOCUSATE SODIUM 100 MG PO CAPS
100.0000 mg | ORAL_CAPSULE | Freq: Two times a day (BID) | ORAL | Status: DC
  Administered 2021-12-05 – 2021-12-07 (×4): 100 mg via ORAL
  Filled 2021-12-05 (×4): qty 1

## 2021-12-05 MED ORDER — OXYCODONE HCL 5 MG PO TABS: 5.0000 mg | ORAL_TABLET | Freq: Once | ORAL | Status: DC | PRN

## 2021-12-05 MED ORDER — CEFAZOLIN SODIUM-DEXTROSE 2-4 GM/100ML-% IV SOLN
2.0000 g | INTRAVENOUS | Status: AC
  Administered 2021-12-05: 2 g via INTRAVENOUS
  Filled 2021-12-05: qty 100

## 2021-12-05 MED ORDER — ALUM & MAG HYDROXIDE-SIMETH 200-200-20 MG/5ML PO SUSP: 30.0000 mL | ORAL | Status: DC | PRN

## 2021-12-05 MED ORDER — GLYCOPYRROLATE 0.2 MG/ML IJ SOLN
INTRAMUSCULAR | Status: AC
  Filled 2021-12-05: qty 1

## 2021-12-05 MED ORDER — ONDANSETRON HCL 4 MG/2ML IJ SOLN
INTRAMUSCULAR | Status: DC | PRN
  Administered 2021-12-05: 4 mg via INTRAVENOUS

## 2021-12-05 MED ORDER — ACETAMINOPHEN 500 MG PO TABS
1000.0000 mg | ORAL_TABLET | Freq: Once | ORAL | Status: AC
  Administered 2021-12-05: 1000 mg via ORAL
  Filled 2021-12-05: qty 2

## 2021-12-05 MED ORDER — GLYCOPYRROLATE 0.2 MG/ML IJ SOLN
INTRAMUSCULAR | Status: DC | PRN
  Administered 2021-12-05: .2 mg via INTRAVENOUS

## 2021-12-05 MED ORDER — DIPHENHYDRAMINE HCL 12.5 MG/5ML PO ELIX: 12.5000 mg | ORAL_SOLUTION | ORAL | Status: DC | PRN

## 2021-12-05 MED ORDER — DEXAMETHASONE SODIUM PHOSPHATE 10 MG/ML IJ SOLN
8.0000 mg | Freq: Once | INTRAMUSCULAR | Status: AC
  Administered 2021-12-05: 8 mg via INTRAVENOUS

## 2021-12-05 MED ORDER — PHENOL 1.4 % MT LIQD: 1.0000 | OROMUCOSAL | Status: DC | PRN

## 2021-12-05 MED ORDER — PROPOFOL 1000 MG/100ML IV EMUL
INTRAVENOUS | Status: AC
  Filled 2021-12-05: qty 100

## 2021-12-05 MED ORDER — 0.9 % SODIUM CHLORIDE (POUR BTL) OPTIME
TOPICAL | Status: DC | PRN
  Administered 2021-12-05: 1000 mL

## 2021-12-05 MED ORDER — METHOCARBAMOL 500 MG IVPB - SIMPLE MED: 500.0000 mg | Freq: Four times a day (QID) | INTRAVENOUS | Status: DC | PRN

## 2021-12-05 MED ORDER — PHENYLEPHRINE HCL-NACL 20-0.9 MG/250ML-% IV SOLN
INTRAVENOUS | Status: DC | PRN
  Administered 2021-12-05: 30 ug/min via INTRAVENOUS

## 2021-12-05 MED ORDER — ACETAMINOPHEN 500 MG PO TABS: 1000.0000 mg | ORAL_TABLET | Freq: Once | ORAL | Status: DC

## 2021-12-05 MED ORDER — OXYCODONE HCL 5 MG PO TABS: ORAL_TABLET | ORAL | 0 refills | Status: AC

## 2021-12-05 MED ORDER — LACTATED RINGERS IV SOLN: INTRAVENOUS | Status: DC

## 2021-12-05 MED ORDER — METOPROLOL SUCCINATE ER 50 MG PO TB24
100.0000 mg | ORAL_TABLET | Freq: Every day | ORAL | Status: DC
  Administered 2021-12-06 – 2021-12-07 (×2): 100 mg via ORAL
  Filled 2021-12-05 (×2): qty 2

## 2021-12-05 MED ORDER — OXYCODONE HCL 5 MG PO TABS: 5.0000 mg | ORAL_TABLET | ORAL | Status: DC | PRN

## 2021-12-05 MED ORDER — NAPROXEN 250 MG PO TABS: 250.0000 mg | ORAL_TABLET | Freq: Two times a day (BID) | ORAL | Status: DC

## 2021-12-05 MED ORDER — ENOXAPARIN SODIUM 40 MG/0.4ML IJ SOSY
40.0000 mg | PREFILLED_SYRINGE | INTRAMUSCULAR | Status: DC
  Administered 2021-12-06 – 2021-12-07 (×2): 40 mg via SUBCUTANEOUS
  Filled 2021-12-05 (×2): qty 0.4

## 2021-12-05 MED ORDER — ONDANSETRON HCL 4 MG/2ML IJ SOLN
INTRAMUSCULAR | Status: AC
  Filled 2021-12-05: qty 2

## 2021-12-05 SURGICAL SUPPLY — 46 items
BAG COUNTER SPONGE SURGICOUNT (BAG) IMPLANT
BAG ZIPLOCK 12X15 (MISCELLANEOUS) IMPLANT
BLADE SAG 18X100X1.27 (BLADE) ×1 IMPLANT
BLADE SURG SZ10 CARB STEEL (BLADE) ×1 IMPLANT
CHLORAPREP W/TINT 26 (MISCELLANEOUS) ×1 IMPLANT
CLSR STERI-STRIP ANTIMIC 1/2X4 (GAUZE/BANDAGES/DRESSINGS) ×1 IMPLANT
COVER PERINEAL POST (MISCELLANEOUS) ×1 IMPLANT
COVER SURGICAL LIGHT HANDLE (MISCELLANEOUS) ×1 IMPLANT
DRAPE IMP U-DRAPE 54X76 (DRAPES) ×1 IMPLANT
DRAPE STERI IOBAN 125X83 (DRAPES) ×1 IMPLANT
DRAPE U-SHAPE 47X51 STRL (DRAPES) ×2 IMPLANT
DRSG AQUACEL AG ADV 3.5X 6 (GAUZE/BANDAGES/DRESSINGS) IMPLANT
DRSG MEPILEX POST OP 4X8 (GAUZE/BANDAGES/DRESSINGS) ×1 IMPLANT
ELECT REM PT RETURN 15FT ADLT (MISCELLANEOUS) ×1 IMPLANT
GLOVE BIO SURGEON STRL SZ7.5 (GLOVE) ×1 IMPLANT
GLOVE BIOGEL PI IND STRL 7.5 (GLOVE) ×1 IMPLANT
GLOVE BIOGEL PI IND STRL 8 (GLOVE) ×1 IMPLANT
GLOVE SURG SYN 7.5  E (GLOVE) ×1
GLOVE SURG SYN 7.5 E (GLOVE) ×1 IMPLANT
GLOVE SURG SYN 7.5 PF PI (GLOVE) ×1 IMPLANT
GOWN STRL REUS W/ TWL LRG LVL3 (GOWN DISPOSABLE) ×1 IMPLANT
GOWN STRL REUS W/ TWL XL LVL3 (GOWN DISPOSABLE) ×1 IMPLANT
GOWN STRL REUS W/TWL LRG LVL3 (GOWN DISPOSABLE) ×1
GOWN STRL REUS W/TWL XL LVL3 (GOWN DISPOSABLE) ×1
HEAD CERAMIC FEMORAL 36MM (Head) IMPLANT
HOLDER FOLEY CATH W/STRAP (MISCELLANEOUS) IMPLANT
INSERT 0 DEGREE 36 (Miscellaneous) IMPLANT
KIT TURNOVER KIT A (KITS) IMPLANT
MANIFOLD NEPTUNE II (INSTRUMENTS) ×1 IMPLANT
NS IRRIG 1000ML POUR BTL (IV SOLUTION) ×1 IMPLANT
PACK ANTERIOR HIP CUSTOM (KITS) ×1 IMPLANT
PROTECTOR NERVE ULNAR (MISCELLANEOUS) ×1 IMPLANT
SCREW HEX LP 6.5X15 (Screw) IMPLANT
SCREW HEX LP 6.5X20 (Screw) IMPLANT
SHELL MULTIHOLE ACETAB 48D (Shell) IMPLANT
SPIKE FLUID TRANSFER (MISCELLANEOUS) ×2 IMPLANT
STEM HIP 127 DEG (Stem) IMPLANT
SUT MNCRL AB 3-0 PS2 18 (SUTURE) ×1 IMPLANT
SUT VIC AB 0 CT1 36 (SUTURE) ×1 IMPLANT
SUT VIC AB 1 CT1 36 (SUTURE) ×1 IMPLANT
SUT VIC AB 2-0 CT1 27 (SUTURE) ×2
SUT VIC AB 2-0 CT1 TAPERPNT 27 (SUTURE) ×2 IMPLANT
TRAY FOLEY MTR SLVR 16FR STAT (SET/KITS/TRAYS/PACK) IMPLANT
TRIDENT II TRITANIUM MULTIHOLE ACETABULAR SHELL IMPLANT
TUBE SUCTION HIGH CAP CLEAR NV (SUCTIONS) ×1 IMPLANT
WATER STERILE IRR 1000ML POUR (IV SOLUTION) ×2 IMPLANT

## 2021-12-05 NOTE — Op Note (Signed)
12/05/2021  11:23 AM  PATIENT:  Deanna Wright   MRN: [id number]  PRE-OPERATIVE DIAGNOSIS:  OA LEFT HIP  POST-OPERATIVE DIAGNOSIS:  OA LEFT HIP  PROCEDURE:  Procedure(s): TOTAL HIP ARTHROPLASTY ANTERIOR APPROACH  PREOPERATIVE INDICATIONS:    ANEVAY CAMPANELLA is an 81 y.o. female who has a diagnosis of <principal problem not specified> and elected for surgical management after failing conservative treatment.  The risks benefits and alternatives were discussed with the patient including but not limited to the risks of nonoperative treatment, versus surgical intervention including infection, bleeding, nerve injury, periprosthetic fracture, the need for revision surgery, dislocation, leg length discrepancy, blood clots, cardiopulmonary complications, morbidity, mortality, among others, and they were willing to proceed.     OPERATIVE REPORT     SURGEON:   Renette Butters, MD    ASSISTANT:  Aggie Moats, PA-C, he was present and scrubbed throughout the case, critical for completion in a timely fashion, and for retraction, instrumentation, and closure.     ANESTHESIA:  General    COMPLICATIONS:  None.     COMPONENTS:  Stryker acolade fit femur size 5 with a 36 mm -2.5 head ball and an acetabular shell size 48 with a  polyethylene liner    PROCEDURE IN DETAIL:   The patient was met in the holding area and  identified.  The appropriate hip was identified and marked at the operative site.  The patient was then transported to the OR  and  placed under anesthesia per that record.  At that point, the patient was  placed in the supine position and  secured to the operating room table and all bony prominences padded. He received pre-operative antibiotics    The operative lower extremity was prepped from the iliac crest to the distal leg.  Sterile draping was performed.  Time out was performed prior to incision.      Skin incision was made just 2 cm lateral to the ASIS  extending in  line with the tensor fascia lata. Electrocautery was used to control all bleeders. I dissected down sharply to the fascia of the tensor fascia lata was confirmed that the muscle fibers beneath were running posteriorly. I then incised the fascia over the superficial tensor fascia lata in line with the incision. The fascia was elevated off the anterior aspect of the muscle the muscle was retracted posteriorly and protected throughout the case. I then used electrocautery to incise the tensor fascia lata fascia control and all bleeders. Immediately visible was the fat over top of the anterior neck and capsule.  I removed the anterior fat from the capsule and elevated the rectus muscle off of the anterior capsule. I then removed a large time of capsule. The retractors were then placed over the anterior acetabulum as well as around the superior and inferior neck.  I then made a femoral neck cut. Then used the power corkscrew to remove the femoral head from the acetabulum and thoroughly irrigated the acetabulum. I sized the femoral head.    I then exposed the deep acetabulum, cleared out any tissue including the ligamentum teres.   After adequate visualization, I excised the labrum, and then sequentially reamed.  I then impacted the acetabular implant into place using fluoroscopy for guidance.  Appropriate version and inclination was confirmed clinically matching their bony anatomy, and with fluoroscopy.  I placed a 20 mm screw in the posterior/superio position with an excellent bite.    I then placed the polyethylene liner in  place  I then adducted the leg and released the external rotators from the posterior femur allowing it to be easily delivered up lateral and anterior to the acetabulum for preparation of the femoral canal.    I then prepared the proximal femur using the cookie-cutter and then sequentially reamed and broached.  A trial broach, neck, and head was utilized, and I reduced the hip and used  floroscopy to assess the neck length and femoral implant.  I then impacted the femoral prosthesis into place into the appropriate version. The hip was then reduced and fluoroscopy confirmed appropriate position. Leg lengths were restored.  I then irrigated the hip copiously again with, and repaired the fascia with Vicryl, followed by monocryl for the subcutaneous tissue, Monocryl for the skin, Steri-Strips and sterile gauze. The patient was then awakened and returned to PACU in stable and satisfactory condition. There were no complications.  POST OPERATIVE PLAN: WBAT, DVT px: SCD's/TED, ambulation and chemical dvt px  Edmonia Lynch, MD Orthopedic Surgeon [phone]

## 2021-12-05 NOTE — Plan of Care (Signed)
  Problem: Education: Goal: Knowledge of the prescribed therapeutic regimen will improve Outcome: Progressing   Problem: Activity: Goal: Ability to avoid complications of mobility impairment will improve Outcome: Progressing   Problem: Pain Management: Goal: Pain level will decrease with appropriate interventions Outcome: Progressing   Problem: Education: Goal: Knowledge of General Education information will improve Description: Including pain rating scale, medication(s)/side effects and non-pharmacologic comfort measures Outcome: Progressing   Problem: Activity: Goal: Risk for activity intolerance will decrease Outcome: Progressing   Problem: Nutrition: Goal: Adequate nutrition will be maintained Outcome: Progressing   Problem: Pain Managment: Goal: General experience of comfort will improve Outcome: Progressing   

## 2021-12-05 NOTE — Interval H&P Note (Signed)
History and Physical Interval Note:  12/05/2021 9:51 AM  Deanna Wright  has presented today for surgery, with the diagnosis of OA LEFT HIP.  The various methods of treatment have been discussed with the patient and family. After consideration of risks, benefits and other options for treatment, the patient has consented to  Procedure(s): TOTAL HIP ARTHROPLASTY ANTERIOR APPROACH (Left) as a surgical intervention.  The patient's history has been reviewed, patient examined, no change in status, stable for surgery.  I have reviewed the patient's chart and labs.  Questions were answered to the patient's satisfaction.     Renette Butters

## 2021-12-05 NOTE — Anesthesia Preprocedure Evaluation (Addendum)
Anesthesia Evaluation  Patient identified by MRN, date of birth, ID band Patient awake    Reviewed: Allergy & Precautions, NPO status , Patient's Chart, lab work & pertinent test results, reviewed documented beta blocker date and time   History of Anesthesia Complications (+) PROLONGED EMERGENCE and history of anesthetic complications  Airway Mallampati: III  TM Distance: >3 FB Neck ROM: Full    Dental  (+) Dental Advisory Given   Pulmonary asthma    Pulmonary exam normal        Cardiovascular hypertension, Pt. on medications and Pt. on home beta blockers Normal cardiovascular exam     Neuro/Psych negative neurological ROS  negative psych ROS   GI/Hepatic Neg liver ROS, PUD,GERD  Medicated and Controlled,,  Endo/Other  negative endocrine ROS    Renal/GU negative Renal ROS     Musculoskeletal  (+) Arthritis , Osteoarthritis,   Significant scoliosis    Abdominal   Peds  Hematology  (+) Blood dyscrasia, anemia   Anesthesia Other Findings   Reproductive/Obstetrics                             Anesthesia Physical Anesthesia Plan  ASA: 2  Anesthesia Plan: Spinal   Post-op Pain Management: Tylenol PO (pre-op)*   Induction:   PONV Risk Score and Plan: 2 and Treatment may vary due to age or medical condition and Propofol infusion  Airway Management Planned: Natural Airway and Simple Face Mask  Additional Equipment: None  Intra-op Plan:   Post-operative Plan:   Informed Consent: I have reviewed the patients History and Physical, chart, labs and discussed the procedure including the risks, benefits and alternatives for the proposed anesthesia with the patient or authorized representative who has indicated his/her understanding and acceptance.       Plan Discussed with: CRNA and Anesthesiologist  Anesthesia Plan Comments: (Labs reviewed, platelets acceptable. Discussed risks and  benefits of spinal, including spinal/epidural hematoma, infection, failed block, and PDPH. Patient expressed understanding and wished to proceed. )        Anesthesia Quick Evaluation

## 2021-12-05 NOTE — Anesthesia Postprocedure Evaluation (Signed)
Anesthesia Post Note  Patient: Deanna Wright  Procedure(s) Performed: TOTAL HIP ARTHROPLASTY ANTERIOR APPROACH (Left: Hip)     Patient location during evaluation: PACU Anesthesia Type: Spinal Level of consciousness: awake and alert Pain management: pain level controlled Vital Signs Assessment: post-procedure vital signs reviewed and stable Respiratory status: spontaneous breathing and respiratory function stable Cardiovascular status: blood pressure returned to baseline and stable Postop Assessment: spinal receding and no apparent nausea or vomiting Anesthetic complications: no   No notable events documented.  Last Vitals:  Vitals:   12/05/21 1230 12/05/21 1245  BP: (!) 145/57 (!) 156/96  Pulse: (!) 50 (!) 53  Resp: 12 16  Temp:    SpO2: 98% 97%    Last Pain:  Vitals:   12/05/21 1245  TempSrc:   PainSc: 0-No pain                 Audry Pili

## 2021-12-05 NOTE — Interval H&P Note (Signed)
History and Physical Interval Note:  12/05/2021 8:56 AM  Deanna Wright  has presented today for surgery, with the diagnosis of OA LEFT HIP.  The various methods of treatment have been discussed with the patient and family. After consideration of risks, benefits and other options for treatment, the patient has consented to  Procedure(s): TOTAL HIP ARTHROPLASTY ANTERIOR APPROACH (Left) as a surgical intervention.  The patient's history has been reviewed, patient examined, no change in status, stable for surgery.  I have reviewed the patient's chart and labs.  Questions were answered to the patient's satisfaction.     Renette Butters

## 2021-12-05 NOTE — Transfer of Care (Signed)
Immediate Anesthesia Transfer of Care Note  Patient: Deanna Wright  Procedure(s) Performed: TOTAL HIP ARTHROPLASTY ANTERIOR APPROACH (Left: Hip)  Patient Location: PACU  Anesthesia Type:Spinal  Level of Consciousness: drowsy  Airway & Oxygen Therapy: Patient Spontanous Breathing and Patient connected to face mask  Post-op Assessment: Report given to RN and Post -op Vital signs reviewed and stable  Post vital signs: Reviewed and stable  Last Vitals:  Vitals Value Taken Time  BP 109/57 12/05/21 1143  Temp    Pulse 57 12/05/21 1143  Resp 12 12/05/21 1143  SpO2 100 % 12/05/21 1143  Vitals shown include unvalidated device data.  Last Pain:  Vitals:   12/05/21 0812  TempSrc: Oral         Complications: No notable events documented.

## 2021-12-05 NOTE — Evaluation (Signed)
Physical Therapy Evaluation Patient Details Name: Deanna Wright MRN: [id number] DOB: Aug 03, 1940 Today's Date: 12/05/2021  History of Present Illness  Pt is a 81 yo female presenting s/p L-tHA, AA on 12/05/21. 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   Clinical Impression  Deanna Wright is a 81 y.o. female POD 0 s/p L-THA, AA. Patient reports modified independence using RW and "furniture surfing at home" for  mobility at baseline. Patient is now limited by functional impairments (see PT problem list below) and requires min assist for bed mobility and min guard for transfers. Patient was able to ambulate 3 feet with RW and min guard level of assist, distance limited by pain. Pt is pain-dominated but motivated to participate in therapy. Patient instructed in exercise to facilitate ROM and circulation to manage edema. Provided incentive spirometer and with Vcs pt able to achieve 1037m. Patient will benefit from continued skilled PT interventions to address impairments and progress towards PLOF. Acute PT will follow to progress mobility and stair training in preparation for safe discharge home.       Recommendations for follow up therapy are one component of a multi-disciplinary discharge planning process, led by the attending physician.  Recommendations may be updated based on patient status, additional functional criteria and insurance authorization.  Follow Up Recommendations Follow physician's recommendations for discharge plan and follow up therapies      Assistance Recommended at Discharge Frequent or constant Supervision/Assistance  Patient can return home with the following  A little help with walking and/or transfers;A little help with bathing/dressing/bathroom;Assistance with cooking/housework;Help with stairs or ramp for entrance;Assist for transportation    Equipment Recommendations None recommended by PT  Recommendations for Other  Services       Functional Status Assessment Patient has had a recent decline in their functional status and demonstrates the ability to make significant improvements in function in a reasonable and predictable amount of time.     Precautions / Restrictions Precautions Precautions: Fall Restrictions Weight Bearing Restrictions: No Other Position/Activity Restrictions: wbat      Mobility  Bed Mobility Overal bed mobility: Needs Assistance Bed Mobility: Supine to Sit, Sit to Supine     Supine to sit: Min assist, HOB elevated Sit to supine: Min assist   General bed mobility comments: Pt required min assist to bring LLE off and back on bed, otherwise min guard, increased time secondary to pain.    Transfers Overall transfer level: Needs assistance Equipment used: Rolling walker (2 wheels) Transfers: Sit to/from Stand, Bed to chair/wheelchair/BSC Sit to Stand: Modified independent (Device/Increase time)   Step pivot transfers: Min guard       General transfer comment: Sit to stand: pt modified independent as she stood before pt provided instructions. Step pivot: pt min guard for safety, no physical assist required.    Ambulation/Gait Ambulation/Gait assistance: Min guard Gait Distance (Feet): 3 Feet Assistive device: Rolling walker (2 wheels) Gait Pattern/deviations: Step-to pattern Gait velocity: decreased     General Gait Details: Pt ambulated 3 ft with RW, no physical assist required or overt LOB, distance limited by pain, pt crying out and grimacing. Further mobility deferred  Stairs            Wheelchair Mobility    Modified Rankin (Stroke Patients Only)       Balance Overall balance assessment: Needs assistance Sitting-balance support: Feet supported, No upper extremity supported Sitting balance-Leahy Scale: Good     Standing balance support: During  functional activity, Reliant on assistive device for balance, Bilateral upper extremity  supported Standing balance-Leahy Scale: Poor                               Pertinent Vitals/Pain Pain Assessment Pain Assessment: 0-10 Pain Score: 10-Worst pain ever Pain Location: left hip and thigh Pain Descriptors / Indicators: Operative site guarding Pain Intervention(s): Limited activity within patient's tolerance, Monitored during session, Repositioned, Ice applied    Home Living Family/patient expects to be discharged to:: Private residence Living Arrangements: Alone Available Help at Discharge: Family;Neighbor;Available PRN/intermittently (Sister, Brother, and neighbor) Type of Home: House Home Access: Stairs to enter Entrance Stairs-Rails: None Entrance Stairs-Number of Steps: 1   Home Layout: One level Home Equipment: International aid/development worker (2 wheels);Cane - single point;Toilet riser      Prior Function Prior Level of Function : Independent/Modified Independent;History of Falls (last six months)             Mobility Comments: RW, furniture surfs at baseline Fall: pressed on recliner and it threw her out, hit her left knee. Called Dr. Percell Miller afterward. ADLs Comments: IND     Hand Dominance        Extremity/Trunk Assessment   Upper Extremity Assessment Upper Extremity Assessment: Overall WFL for tasks assessed    Lower Extremity Assessment Lower Extremity Assessment: RLE deficits/detail;LLE deficits/detail RLE Deficits / Details: MMT ank DF/PF 5/5 RLE Sensation: WNL LLE Deficits / Details: MMT ank DF/PF 5/5. LLE Sensation: WNL    Cervical / Trunk Assessment Cervical / Trunk Assessment: Kyphotic  Communication   Communication: No difficulties  Cognition Arousal/Alertness: Awake/alert Behavior During Therapy: WFL for tasks assessed/performed Overall Cognitive Status: Within Functional Limits for tasks assessed                                          General Comments      Exercises Total Joint  Exercises Ankle Circles/Pumps: AROM, Both, 10 reps   Assessment/Plan    PT Assessment Patient needs continued PT services  PT Problem List Decreased strength;Decreased range of motion;Decreased activity tolerance;Decreased balance;Decreased mobility;Decreased coordination;Pain       PT Treatment Interventions DME instruction;Gait training;Stair training;Functional mobility training;Therapeutic activities;Therapeutic exercise;Balance training;Neuromuscular re-education;Patient/family education    PT Goals (Current goals can be found in the Care Plan section)  Acute Rehab PT Goals Patient Stated Goal: To go home PT Goal Formulation: With patient Time For Goal Achievement: 12/12/21 Potential to Achieve Goals: Good    Frequency 7X/week     Co-evaluation               AM-PAC PT "6 Clicks" Mobility  Outcome Measure Help needed turning from your back to your side while in a flat bed without using bedrails?: A Little Help needed moving from lying on your back to sitting on the side of a flat bed without using bedrails?: A Little Help needed moving to and from a bed to a chair (including a wheelchair)?: A Little Help needed standing up from a chair using your arms (e.g., wheelchair or bedside chair)?: A Little Help needed to walk in hospital room?: A Little Help needed climbing 3-5 steps with a railing? : A Little 6 Click Score: 18    End of Session Equipment Utilized During Treatment: Gait belt Activity Tolerance: Patient limited by pain Patient  left: in chair;with call bell/phone within reach;with chair alarm set Nurse Communication: Mobility status PT Visit Diagnosis: Pain;Difficulty in walking, not elsewhere classified (R26.2) Pain - Right/Left: Left Pain - part of body: Hip    Time: 1735-1805 PT Time Calculation (min) (ACUTE ONLY): 30 min   Charges:   PT Evaluation $PT Eval Low Complexity: 1 Low PT Treatments $Therapeutic Activity: 8-22 mins        Coolidge Breeze, PT, DPT [facility] Department Office: [phone] Weekend pager: [phone]  Coolidge Breeze 12/05/2021, 6:21 PM

## 2021-12-05 NOTE — Anesthesia Procedure Notes (Signed)
Spinal  Patient location during procedure: OR Start time: 12/05/2021 9:59 AM End time: 12/05/2021 10:02 AM Reason for block: surgical anesthesia Staffing Performed: anesthesiologist  Anesthesiologist: Audry Pili, MD Performed by: Audry Pili, MD Authorized by: Audry Pili, MD   Preanesthetic Checklist Completed: patient identified, IV checked, risks and benefits discussed, surgical consent, monitors and equipment checked, pre-op evaluation and timeout performed Spinal Block Patient position: sitting Prep: DuraPrep Patient monitoring: heart rate, cardiac monitor, continuous pulse ox and blood pressure Approach: midline Location: L3-4 Injection technique: single-shot Needle Needle type: Whitacre  Needle gauge: 19 G Additional Notes Consent was obtained prior to the procedure with all questions answered and concerns addressed. Risks including, but not limited to, bleeding, infection, nerve damage, paralysis, failed block, inadequate analgesia, allergic reaction, high spinal, itching, and headache were discussed and the patient wished to proceed. Functioning IV was confirmed and monitors were applied. Sterile prep and drape, including hand hygiene, mask, and sterile gloves were used. The patient was positioned and the spine was prepped. The skin was anesthetized with lidocaine. Free flow of clear CSF was obtained prior to injecting local anesthetic into the CSF. The spinal needle aspirated freely following injection. The needle was carefully withdrawn. The patient tolerated the procedure well.   Renold Don, MD

## 2021-12-05 NOTE — Anesthesia Procedure Notes (Signed)
Procedure Name: MAC Date/Time: 12/05/2021 10:00 AM  Performed by: Claudia Desanctis, CRNAPre-anesthesia Checklist: Patient identified, Emergency Drugs available, Suction available and Patient being monitored Patient Re-evaluated:Patient Re-evaluated prior to induction Oxygen Delivery Method: Simple face mask

## 2021-12-06 ENCOUNTER — Encounter (HOSPITAL_COMMUNITY): Payer: Self-pay | Admitting: Orthopedic Surgery

## 2021-12-06 ENCOUNTER — Observation Stay (HOSPITAL_COMMUNITY): Payer: Medicare HMO

## 2021-12-06 DIAGNOSIS — M1612 Unilateral primary osteoarthritis, left hip: Secondary | ICD-10-CM | POA: Diagnosis not present

## 2021-12-06 DIAGNOSIS — M79672 Pain in left foot: Secondary | ICD-10-CM | POA: Diagnosis not present

## 2021-12-06 DIAGNOSIS — Z79899 Other long term (current) drug therapy: Secondary | ICD-10-CM | POA: Diagnosis not present

## 2021-12-06 DIAGNOSIS — Z86718 Personal history of other venous thrombosis and embolism: Secondary | ICD-10-CM | POA: Diagnosis not present

## 2021-12-06 DIAGNOSIS — Z7982 Long term (current) use of aspirin: Secondary | ICD-10-CM | POA: Diagnosis not present

## 2021-12-06 DIAGNOSIS — Z96652 Presence of left artificial knee joint: Secondary | ICD-10-CM | POA: Diagnosis not present

## 2021-12-06 DIAGNOSIS — I10 Essential (primary) hypertension: Secondary | ICD-10-CM | POA: Diagnosis not present

## 2021-12-06 DIAGNOSIS — Z888 Allergy status to other drugs, medicaments and biological substances status: Secondary | ICD-10-CM | POA: Diagnosis not present

## 2021-12-06 DIAGNOSIS — K219 Gastro-esophageal reflux disease without esophagitis: Secondary | ICD-10-CM | POA: Diagnosis not present

## 2021-12-06 DIAGNOSIS — Z96641 Presence of right artificial hip joint: Secondary | ICD-10-CM | POA: Diagnosis not present

## 2021-12-06 MED ORDER — PREDNISONE 5 MG PO TABS
10.0000 mg | ORAL_TABLET | Freq: Every day | ORAL | Status: DC
  Administered 2021-12-07: 10 mg via ORAL
  Filled 2021-12-06: qty 2

## 2021-12-06 NOTE — Progress Notes (Signed)
     Subjective: 1 Day Post-Op s/p Procedure(s): TOTAL HIP ARTHROPLASTY ANTERIOR APPROACH   Patient is alert, oriented, sitting up in recliner.  Patient reports pain as moderate.   Denies chest pain, SOB, Calf pain. No nausea/vomiting. No other complaints. Motivated to discharge home today.    Objective:  PE: VITALS:   Vitals:   12/05/21 2319 12/06/21 0154 12/06/21 0300 12/06/21 0500  BP: (!) 150/78 (!) 161/82  (!) 179/97  Pulse: 65 62  70  Resp: '18 18  18  '$ Temp:  98.3 F (36.8 C) 97.9 F (36.6 C) 97.9 F (36.6 C)  TempSrc:  Oral Oral Oral  SpO2: 100% 99%    Weight:      Height:       General: sitting up in recliner, in no acute distress Resp: normal respiratory effort GI: soft, nontender, nondistended Sensation intact distally Intact pulses distally Dorsiflexion/Plantar flexion intact Incision: dressing C/D/I  LABS  Results for orders placed or performed during the hospital encounter of 12/05/21 (from the past 24 hour(s))  ABO/Rh     Status: None   Collection Time: 12/05/21  8:30 AM  Result Value Ref Range   ABO/RH(D)      A POS Performed at [facility], [place] [address] [place], [place] [address]   CBC     Status: Abnormal   Collection Time: 12/05/21  3:39 PM  Result Value Ref Range   WBC 15.8 (H) 4.0 - 10.5 K/uL   RBC 4.16 3.87 - 5.11 MIL/uL   Hemoglobin 10.5 (L) 12.0 - 15.0 g/dL   HCT 34.3 (L) 36.0 - 46.0 %   MCV 82.5 80.0 - 100.0 fL   MCH 25.2 (L) 26.0 - 34.0 pg   MCHC 30.6 30.0 - 36.0 g/dL   RDW 14.2 11.5 - 15.5 %   Platelets 273 150 - 400 K/uL   nRBC 0.0 0.0 - 0.2 %  Creatinine, serum     Status: None   Collection Time: 12/05/21  3:39 PM  Result Value Ref Range   Creatinine, Ser 0.90 0.44 - 1.00 mg/dL   GFR, Estimated >60 >60 mL/min    DG HIP UNILAT WITH PELVIS 1V LEFT  Result Date: 12/05/2021 CLINICAL DATA:  Intra op left hip replacement. EXAM: DG HIP (WITH OR WITHOUT PELVIS) 1V*L* COMPARISON:  None Available. FINDINGS:  Three fluoroscopic spot views in the operating room. Left hip arthroplasty in place. Fluoroscopy time 15 seconds. Dose 1.13 mGy. IMPRESSION: Procedural fluoroscopy during left hip arthroplasty Electronically Signed   By: Keith Rake M.D.   On: 12/05/2021 12:46   DG C-Arm 1-60 Min-No Report  Result Date: 12/05/2021 Fluoroscopy was utilized by the requesting physician.  No radiographic interpretation.    Assessment/Plan: Principal Problem:   Status post left hip replacement  1 Day Post-Op s/p Procedure(s): TOTAL HIP ARTHROPLASTY ANTERIOR APPROACH  Weightbearing: WBAT LLE, up with therapy Insicional and dressing care: Dressings left intact until follow-up VTE prophylaxis: lovenox while inpatient, discharging home on Xarelto Pain control: continue current regimen  Follow - up plan: 2 weeks with Dr. Percell Miller Dipso: pending progress with PT today, plan to discharge home after passes PT  Contact information:   Merlene Pulling, PA-C Weekdays 8-5  After hours and holidays please check [email or web address] for group call information for [facility]  Ventura Bruns 12/06/2021, 8:01 AM

## 2021-12-06 NOTE — Progress Notes (Signed)
Physical Therapy Treatment Patient Details Name: Deanna Wright MRN: [id number] DOB: 12/16/1940 Today's Date: 12/06/2021   History of Present Illness Pt is a 81 yo female presenting s/p L-THA, AA on 12/05/21. 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    PT Comments    Pt had pain meds prior to session and also had been able to ambulate into bathroom with nursing staff prior to arrival.  Pt required increased time and provided with cues for different techniques with ambulating however pt with increased left knee burning and shooting pain.  Pt only able to tolerate a few feet.  RN notified.    Recommendations for follow up therapy are one component of a multi-disciplinary discharge planning process, led by the attending physician.  Recommendations may be updated based on patient status, additional functional criteria and insurance authorization.  Follow Up Recommendations  Follow physician's recommendations for discharge plan and follow up therapies     Assistance Recommended at Discharge Frequent or constant Supervision/Assistance  Patient can return home with the following A little help with walking and/or transfers;A little help with bathing/dressing/bathroom;Assistance with cooking/housework;Help with stairs or ramp for entrance;Assist for transportation   Equipment Recommendations  None recommended by PT    Recommendations for Other Services       Precautions / Restrictions Precautions Precautions: Fall Restrictions Other Position/Activity Restrictions: wbat     Mobility  Bed Mobility               General bed mobility comments: pt in recliner    Transfers Overall transfer level: Needs assistance Equipment used: Rolling walker (2 wheels) Transfers: Sit to/from Stand Sit to Stand: Min guard           General transfer comment: verbal cues for hand placement; effortful    Ambulation/Gait Ambulation/Gait assistance:  Min assist Gait Distance (Feet): 5 Feet Assistive device: Rolling walker (2 wheels) Gait Pattern/deviations: Step-to pattern, Antalgic       General Gait Details: pt reports burning pain starting in her left knee and shooting into her thigh; pt attempted different ways to ambulate with provided cues however unable to tolerate any and needed to step backwards to return to recliner; pt had to scoot left foot along floor for least amount of pain, pt provided with time and rest breaks as needed   Stairs             Wheelchair Mobility    Modified Rankin (Stroke Patients Only)       Balance                                            Cognition Arousal/Alertness: Awake/alert Behavior During Therapy: WFL for tasks assessed/performed Overall Cognitive Status: Within Functional Limits for tasks assessed                                          Exercises     General Comments        Pertinent Vitals/Pain Pain Assessment Pain Assessment: 0-10 Pain Score: 8  Pain Location: left hip and thigh Pain Descriptors / Indicators: Burning Pain Intervention(s): Monitored during session, Premedicated before session, Repositioned    Home Living  Prior Function            PT Goals (current goals can now be found in the care plan section) Progress towards PT goals: Progressing toward goals    Frequency    7X/week      PT Plan Current plan remains appropriate    Co-evaluation              AM-PAC PT "6 Clicks" Mobility   Outcome Measure  Help needed turning from your back to your side while in a flat bed without using bedrails?: A Little Help needed moving from lying on your back to sitting on the side of a flat bed without using bedrails?: A Little Help needed moving to and from a bed to a chair (including a wheelchair)?: A Little Help needed standing up from a chair using your arms (e.g.,  wheelchair or bedside chair)?: A Little Help needed to walk in hospital room?: A Little Help needed climbing 3-5 steps with a railing? : A Lot 6 Click Score: 17    End of Session Equipment Utilized During Treatment: Gait belt Activity Tolerance: Patient limited by pain Patient left: in chair;with call bell/phone within reach;with chair alarm set Nurse Communication: Mobility status PT Visit Diagnosis: Pain;Difficulty in walking, not elsewhere classified (R26.2) Pain - Right/Left: Left Pain - part of body: Knee     Time: 1337-1401 PT Time Calculation (min) (ACUTE ONLY): 24 min  Charges:  $Gait Training: 23-37 mins                    Jannette Spanner PT, DPT Physical Therapist Acute Rehabilitation Services Preferred contact method: Secure Chat Weekend Pager Only: [phone] Office: [phone]    [name] 12/06/2021, 3:04 PM

## 2021-12-06 NOTE — Progress Notes (Signed)
Physical Therapy Treatment Patient Details Name: Deanna Wright MRN: [id number] DOB: 1940-11-03 Today's Date: 12/06/2021   History of Present Illness Pt is a 80 yo female presenting s/p L-tHA, AA on 12/05/21. 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    PT Comments    Pt attempted to ambulate however unable to tolerate advancing L LE due to pain.  Pt assisted with performing LE exercises and tolerated exercises well.  Pt had had pain medication an hour prior to session.      Recommendations for follow up therapy are one component of a multi-disciplinary discharge planning process, led by the attending physician.  Recommendations may be updated based on patient status, additional functional criteria and insurance authorization.  Follow Up Recommendations  Follow physician's recommendations for discharge plan and follow up therapies     Assistance Recommended at Discharge Frequent or constant Supervision/Assistance  Patient can return home with the following A little help with walking and/or transfers;A little help with bathing/dressing/bathroom;Assistance with cooking/housework;Help with stairs or ramp for entrance;Assist for transportation   Equipment Recommendations  None recommended by PT    Recommendations for Other Services       Precautions / Restrictions Precautions Precautions: Fall Restrictions Other Position/Activity Restrictions: wbat     Mobility  Bed Mobility               General bed mobility comments: pt in recliner    Transfers Overall transfer level: Needs assistance Equipment used: Rolling walker (2 wheels) Transfers: Sit to/from Stand Sit to Stand: Min guard           General transfer comment: verbal cues for hand placement; effortful    Ambulation/Gait Ambulation/Gait assistance: Min guard Gait Distance (Feet): 3 Feet Assistive device: Rolling walker (2 wheels) Gait Pattern/deviations: Step-to  pattern, Antalgic       General Gait Details: pt reports weight bearing feels okay however crying out, wincing, and grimacing with attempting to advance L LE despite any cues provided; pt unable to take more then a few steps forwards and then backwards to recliner due to pain   Stairs             Wheelchair Mobility    Modified Rankin (Stroke Patients Only)       Balance                                            Cognition Arousal/Alertness: Awake/alert Behavior During Therapy: WFL for tasks assessed/performed Overall Cognitive Status: Within Functional Limits for tasks assessed                                          Exercises Total Joint Exercises Ankle Circles/Pumps: AROM, Both, 10 reps Quad Sets: AROM, Left, 10 reps Heel Slides: AAROM, Left, 10 reps Hip ABduction/ADduction: AAROM, Left, 10 reps Long Arc Quad: AROM, Left, 10 reps, Seated    General Comments        Pertinent Vitals/Pain Pain Assessment Pain Assessment: 0-10 Pain Score: 8  Pain Location: left hip and thigh Pain Descriptors / Indicators: Burning Pain Intervention(s): Monitored during session, Premedicated before session, Repositioned    Home Living  Prior Function            PT Goals (current goals can now be found in the care plan section) Progress towards PT goals: Progressing toward goals    Frequency    7X/week      PT Plan Current plan remains appropriate    Co-evaluation              AM-PAC PT "6 Clicks" Mobility   Outcome Measure  Help needed turning from your back to your side while in a flat bed without using bedrails?: A Little Help needed moving from lying on your back to sitting on the side of a flat bed without using bedrails?: A Little Help needed moving to and from a bed to a chair (including a wheelchair)?: A Little Help needed standing up from a chair using your arms (e.g.,  wheelchair or bedside chair)?: A Little Help needed to walk in hospital room?: A Little Help needed climbing 3-5 steps with a railing? : A Lot 6 Click Score: 17    End of Session Equipment Utilized During Treatment: Gait belt Activity Tolerance: Patient limited by pain Patient left: in chair;with call bell/phone within reach;with chair alarm set Nurse Communication: Mobility status PT Visit Diagnosis: Pain;Difficulty in walking, not elsewhere classified (R26.2) Pain - Right/Left: Left Pain - part of body: Hip     Time: 0802-2336 PT Time Calculation (min) (ACUTE ONLY): 20 min  Charges:  $Therapeutic Exercise: 8-22 mins                     Jannette Spanner PT, DPT Physical Therapist Acute Rehabilitation Services Preferred contact method: Secure Chat Weekend Pager Only: [phone] Office: [phone]    [name] 12/06/2021, 12:55 PM

## 2021-12-06 NOTE — Plan of Care (Signed)
  Problem: Pain Management: Goal: Pain level will decrease with appropriate interventions Outcome: Progressing   Problem: Nutrition: Goal: Adequate nutrition will be maintained Outcome: Progressing   

## 2021-12-06 NOTE — TOC Transition Note (Signed)
Transition of Care [facility]) - CM/SW Discharge Note   Patient Details  Name: MAURISSA AMBROSE MRN: [id number] Date of Birth: [birth year]  [facility] CM/SW Contact:  Lennart Pall, LCSW Phone Number: 12/06/2021, 10:19 AM   Clinical Narrative:    Met with pt and confirming she has needed DME at home.  HHPT prearranged with [facility].  No TOC needs.   Final next level of care: [place] Barriers to Discharge: No Barriers Identified   Patient Goals and CMS Choice Patient states their goals for this hospitalization and ongoing recovery are:: return home      Discharge Placement                       Discharge Plan and Services                DME Arranged: N/A DME Agency: NA       HH Arranged: PT [place] Agency: [facility]        Social Determinants of Health (SDOH) Interventions     Readmission Risk Interventions     No data to display

## 2021-12-07 MED ORDER — PREDNISONE 10 MG PO TABS: 10.0000 mg | ORAL_TABLET | Freq: Every day | ORAL | 0 refills | Status: AC

## 2021-12-07 NOTE — Plan of Care (Signed)
Pt ready to DC home with sister.

## 2021-12-07 NOTE — Progress Notes (Signed)
Physical Therapy Treatment Patient Details Name: Deanna Wright MRN: [id number] DOB: [birth year] Today's Date: 12/07/2021   History of Present Illness Pt is a 81 yo female presenting s/p L-THA, AA on 12/05/21. 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    PT Comments    Pt tolerating movement better today and able to ambulate in hallway 60 feet.  Pt also performed a few exercises.  Will practice one step next session and anticipate pt to d/c home today.    Recommendations for follow up therapy are one component of a multi-disciplinary discharge planning process, led by the attending physician.  Recommendations may be updated based on patient status, additional functional criteria and insurance authorization.  Follow Up Recommendations  Follow physician's recommendations for discharge plan and follow up therapies     Assistance Recommended at Discharge Frequent or constant Supervision/Assistance  Patient can return home with the following A little help with walking and/or transfers;A little help with bathing/dressing/bathroom;Assistance with cooking/housework;Help with stairs or ramp for entrance;Assist for transportation   Equipment Recommendations  None recommended by PT    Recommendations for Other Services       Precautions / Restrictions Precautions Precautions: Fall Restrictions Other Position/Activity Restrictions: wbat     Mobility  Bed Mobility Overal bed mobility: Needs Assistance Bed Mobility: Supine to Sit     Supine to sit: Min guard, HOB elevated          Transfers Overall transfer level: Needs assistance Equipment used: Rolling walker (2 wheels) Transfers: Sit to/from Stand Sit to Stand: Min guard           General transfer comment: verbal cues for hand placement    Ambulation/Gait Ambulation/Gait assistance: Min guard Gait Distance (Feet): 60 Feet Assistive device: Rolling walker (2 wheels) Gait  Pattern/deviations: Step-to pattern, Antalgic, Step-through pattern, Decreased stance time - left Gait velocity: decreased     General Gait Details: cues for technique, pt better able to tolerate movement of Lt LE today, cues for smaller steps and leading with Lt LE seemed to improve pain   Stairs             Wheelchair Mobility    Modified Rankin (Stroke Patients Only)       Balance                                            Cognition Arousal/Alertness: Awake/alert Behavior During Therapy: WFL for tasks assessed/performed Overall Cognitive Status: Within Functional Limits for tasks assessed                                          Exercises Total Joint Exercises Ankle Circles/Pumps: AROM, Both, 10 reps Quad Sets: AROM, Left, 10 reps Heel Slides: AAROM, Left, 10 reps Hip ABduction/ADduction: AAROM, Left, 10 reps    General Comments        Pertinent Vitals/Pain Pain Assessment Pain Assessment: 0-10 Pain Score: 6  Pain Location: left knee and thigh Pain Descriptors / Indicators: Burning, Shooting Pain Intervention(s): Repositioned, Monitored during session, Premedicated before session    Home Living                          Prior Function  PT Goals (current goals can now be found in the care plan section) Progress towards PT goals: Progressing toward goals    Frequency    7X/week      PT Plan Current plan remains appropriate    Co-evaluation              AM-PAC PT "6 Clicks" Mobility   Outcome Measure  Help needed turning from your back to your side while in a flat bed without using bedrails?: A Little Help needed moving from lying on your back to sitting on the side of a flat bed without using bedrails?: A Little Help needed moving to and from a bed to a chair (including a wheelchair)?: A Little Help needed standing up from a chair using your arms (e.g., wheelchair or bedside  chair)?: A Little Help needed to walk in hospital room?: A Little Help needed climbing 3-5 steps with a railing? : A Lot 6 Click Score: 17    End of Session Equipment Utilized During Treatment: Gait belt Activity Tolerance: Patient tolerated treatment well Patient left: in chair;with call bell/phone within reach;with chair alarm set   PT Visit Diagnosis: Pain;Difficulty in walking, not elsewhere classified (R26.2) Pain - Right/Left: Left Pain - part of body: Knee     Time: 4356-8616 PT Time Calculation (min) (ACUTE ONLY): 19 min  Charges:  $Gait Training: 8-22 mins                    Arlyce Dice, DPT Physical Therapist Acute Rehabilitation Services Preferred contact method: Secure Chat Weekend Pager Only: [phone] Office: [phone]    Kati L Payson 12/07/2021, 1:02 PM

## 2021-12-07 NOTE — Progress Notes (Signed)
Physical Therapy Treatment Patient Details Name: Deanna Wright MRN: [id number] DOB: Jun 16, 1940 Today's Date: 12/07/2021   History of Present Illness Pt is a 81 yo female presenting s/p L-THA, AA on 12/05/21. 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    PT Comments    Pt ambulated in hallway and practiced one step.  Pt reports pain more tolerable today and pleased with her ability to ambulate and perform step.  Pt ready for d/c home.    Recommendations for follow up therapy are one component of a multi-disciplinary discharge planning process, led by the attending physician.  Recommendations may be updated based on patient status, additional functional criteria and insurance authorization.  Follow Up Recommendations  Follow physician's recommendations for discharge plan and follow up therapies     Assistance Recommended at Discharge Frequent or constant Supervision/Assistance  Patient can return home with the following A little help with walking and/or transfers;A little help with bathing/dressing/bathroom;Assistance with cooking/housework;Help with stairs or ramp for entrance;Assist for transportation   Equipment Recommendations  None recommended by PT    Recommendations for Other Services       Precautions / Restrictions Precautions Precautions: Fall Restrictions Other Position/Activity Restrictions: wbat     Mobility  Bed Mobility Overal bed mobility: Needs Assistance Bed Mobility: Supine to Sit     Supine to sit: Min guard, HOB elevated     General bed mobility comments: pt in recliner    Transfers Overall transfer level: Needs assistance Equipment used: Rolling walker (2 wheels) Transfers: Sit to/from Stand Sit to Stand: Min guard, Supervision           General transfer comment: verbal cues for hand placement    Ambulation/Gait Ambulation/Gait assistance: Min guard, Supervision Gait Distance (Feet): 60  Feet Assistive device: Rolling walker (2 wheels) Gait Pattern/deviations: Step-to pattern, Antalgic, Step-through pattern, Decreased stance time - left Gait velocity: decreased     General Gait Details: cues for technique, cues again for smaller steps and leading with Lt LE to improve pain   Stairs Stairs: Yes Stairs assistance: Min guard Stair Management: Step to pattern, Forwards, Backwards, With walker Number of Stairs: 1 General stair comments: performed one step both forwards and then also backwards with RW; cues for safety and sequence; pt pleased with ability to perform and not have increased pain   Wheelchair Mobility    Modified Rankin (Stroke Patients Only)       Balance                                            Cognition Arousal/Alertness: Awake/alert Behavior During Therapy: WFL for tasks assessed/performed Overall Cognitive Status: Within Functional Limits for tasks assessed                                          Exercises Total Joint Exercises Ankle Circles/Pumps: AROM, Both, 10 reps Quad Sets: AROM, Left, 10 reps Heel Slides: AAROM, Left, 10 reps Hip ABduction/ADduction: AAROM, Left, 10 reps    General Comments        Pertinent Vitals/Pain Pain Assessment Pain Assessment: 0-10 Pain Score: 5  Pain Location: left knee and thigh Pain Descriptors / Indicators: Burning, Shooting Pain Intervention(s): Monitored during session, Repositioned    Home  Living                          Prior Function            PT Goals (current goals can now be found in the care plan section) Progress towards PT goals: Progressing toward goals    Frequency    7X/week      PT Plan Current plan remains appropriate    Co-evaluation              AM-PAC PT "6 Clicks" Mobility   Outcome Measure  Help needed turning from your back to your side while in a flat bed without using bedrails?: A Little Help  needed moving from lying on your back to sitting on the side of a flat bed without using bedrails?: A Little Help needed moving to and from a bed to a chair (including a wheelchair)?: A Little Help needed standing up from a chair using your arms (e.g., wheelchair or bedside chair)?: A Little Help needed to walk in hospital room?: A Little Help needed climbing 3-5 steps with a railing? : A Little 6 Click Score: 18    End of Session Equipment Utilized During Treatment: Gait belt Activity Tolerance: Patient tolerated treatment well Patient left: in chair;with call bell/phone within reach Nurse Communication: Mobility status PT Visit Diagnosis: Difficulty in walking, not elsewhere classified (R26.2) Pain - Right/Left: Left Pain - part of body: Knee     Time: 1450-1503 PT Time Calculation (min) (ACUTE ONLY): 13 min  Charges:  $Gait Training: 8-22 mins                    Arlyce Dice, DPT Physical Therapist Acute Rehabilitation Services Preferred contact method: Secure Chat Weekend Pager Only: [phone] Office: [phone]    [name] 12/07/2021, 3:59 PM

## 2021-12-07 NOTE — Progress Notes (Signed)
     Subjective: 2 Days Post-Op s/p Procedure(s): TOTAL HIP ARTHROPLASTY ANTERIOR APPROACH  Patient reports pain as moderate.  No pain in her hip. More radicular pain, likely coming from her back. Discussed adding prednisone to her current regimen to help with this. Also discussed x-rays of her foot are normal. She is encouraged by this. She did better with therapy yesterday. Goal is to discharge home today.   Denies chest pain, SOB, Calf pain. No nausea/vomiting. No other complaints. M   Objective:  PE: VITALS:   Vitals:   12/06/21 1025 12/06/21 1307 12/06/21 2231 12/07/21 0509  BP: (!) 156/64 (!) 147/70 (!) 154/75 138/81  Pulse: 65 69 74 79  Resp: '14 16 17 17  '$ Temp: 98.9 F (37.2 C) 98.4 F (36.9 C) 99.8 F (37.7 C) 98.5 F (36.9 C)  TempSrc:   Oral Oral  SpO2: 99% 100% 100% 98%  Weight:      Height:       General: resting in hospital bed, NAD Resp: normal respiratory effort GI: soft, nontender, nondistended Sensation intact distally Intact pulses distally Dorsiflexion/Plantar flexion intact Incision: dressing C/D/I  LABS  No results found for this or any previous visit (from the past 24 hour(s)).   DG Foot 2 Views Left  Result Date: 12/06/2021 CLINICAL DATA:  Left foot pain. EXAM: LEFT FOOT - 2 VIEW COMPARISON:  None Available. FINDINGS: Technically limited due to artifact from overlying sock. No evidence of acute fracture. No dislocation. Mild degenerative spurring in the midfoot. No erosive change or bone destruction. No obvious soft tissue abnormalities, all of soft tissue assessment is limited IMPRESSION: Mild degenerative spurring in the midfoot. No acute osseous abnormality. Technically limited due to overlying sock. Electronically Signed   By: Keith Rake M.D.   On: 12/06/2021 16:22   DG HIP UNILAT WITH PELVIS 1V LEFT  Result Date: 12/05/2021 CLINICAL DATA:  Intra op left hip replacement. EXAM: DG HIP (WITH OR WITHOUT PELVIS) 1V*L* COMPARISON:  None  Available. FINDINGS: Three fluoroscopic spot views in the operating room. Left hip arthroplasty in place. Fluoroscopy time 15 seconds. Dose 1.13 mGy. IMPRESSION: Procedural fluoroscopy during left hip arthroplasty Electronically Signed   By: Keith Rake M.D.   On: 12/05/2021 12:46   DG C-Arm 1-60 Min-No Report  Result Date: 12/05/2021 Fluoroscopy was utilized by the requesting physician.  No radiographic interpretation.    Assessment/Plan: Principal Problem:   Status post left hip replacement  2 Days Post-Op s/p Procedure(s): TOTAL HIP ARTHROPLASTY ANTERIOR APPROACH  Weightbearing: WBAT LLE, up with therapy Insicional and dressing care: Dressings left intact until follow-up VTE prophylaxis: lovenox while inpatient, discharging home on Xarelto Pain control: continue current regimen  Follow - up plan: 2 weeks with Dr. Percell Miller Dipso: pending progress with PT today, plan to discharge home after passes PT.   Contact information:   Dr. Fredonia Highland, Noemi [place], PA-C Weekdays 8-5  After hours and holidays please check [email or web address] for group call information for Sports Med [place] 12/07/2021, 7:44 AM

## 2021-12-08 NOTE — Discharge Summary (Signed)
Patient ID: Deanna Wright MRN: [id number] DOB/AGE: December 22, 1940 81 y.o.  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 Percell Miller  Discharged Condition: stable  Hospital Course: Patient brought in to [facility] for scheduled procedure. She tolerated procedure well.  She was kept for monitoring for pain control and medical monitoring postop. She had some issues with therapy on 11/7 and 11/8. She was kept an additional midnight. She passed PT on 11/9 and her pain as better controlled. She was found to be stable for DC home the morning after surgery.  Patient was instructed on specific activity restrictions and all questions were answered.  Consults: PT  Significant Diagnostic Studies: No additional pertinent studies  Treatments: Surgery  Discharge Exam: General: resting in hospital bed, NAD Resp: normal respiratory effort GI: soft, nontender, nondistended Sensation intact distally Intact pulses distally Dorsiflexion/Plantar flexion intact Incision: dressing C/D/I  Disposition: Discharge disposition: 01-Home or Self Care       Discharge Instructions     Call MD for:  redness, tenderness, or signs of infection (pain, swelling, redness, odor or green/yellow  discharge around incision site)   Complete by: As directed    Call MD for:  redness, tenderness, or signs of infection (pain, swelling, redness, odor or green/yellow discharge around incision site)   Complete by: As directed    Call MD for:  severe uncontrolled pain   Complete by: As directed    Call MD for:  severe uncontrolled pain   Complete by: As directed    Call MD for:  temperature >100.4   Complete by: As directed    Call MD for:  temperature >100.4   Complete by: As directed    Diet - low sodium heart healthy   Complete by: As directed    Diet - low sodium heart healthy   Complete by: As directed       Allergies as of 12/07/2021       Reactions   Diflunisal    Diltiazem Swelling   Leg redness and swelling   Other Other (See Comments)   MD advised to not take muscle relaxers   Stadol [butorphanol] Nausea And Vomiting, Other (See Comments)   BP bottoms out        Medication List     STOP taking these medications    aspirin EC 81 MG tablet   ondansetron 4 MG disintegrating tablet Commonly known as: ZOFRAN-ODT   oxycodone 5 MG capsule Commonly known as: OXY-IR Replaced by: oxyCODONE 5 MG immediate release tablet       TAKE these medications    benazepril 40 MG tablet Commonly known as: LOTENSIN Take 40 mg by mouth  daily.   docusate calcium 240 MG capsule Commonly known as: SURFAK Take 240 mg by mouth as needed for mild constipation.   docusate sodium 100 MG capsule Commonly known as: Colace Take 1 capsule (100 mg total) by mouth 2 (two) times daily. To prevent constipation while taking pain medication.   hydrochlorothiazide 25 MG tablet Commonly known as: HYDRODIURIL Take 25 mg by mouth daily as needed (high blood pressure).   metoprolol succinate 100 MG 24 hr tablet Commonly known as: TOPROL-XL Take 100 mg by mouth every morning.   omeprazole 20 MG capsule Commonly known as: PRILOSEC Take 20 mg by mouth daily as needed (for  heartburn/indigestion.).   ondansetron 4 MG tablet Commonly known as: Zofran Take 1 tablet (4 mg total) by mouth every 8 (eight) hours as needed for up to 7 days for nausea or vomiting.   oxyCODONE 5 MG immediate release tablet Commonly known as: Oxy IR/ROXICODONE Take 1 pill every 6 hrs as needed for severe pain Replaces: oxycodone 5 MG capsule   oxyCODONE-acetaminophen 10-325 MG tablet Commonly known as: PERCOCET Take 1 tablet by mouth daily as needed for pain.   potassium chloride 10 MEQ tablet Commonly known as: KLOR-CON M Take 10 mEq by mouth daily as needed (Low potassium).   predniSONE 10 MG tablet Commonly known as: DELTASONE Take 1 tablet (10 mg total) by mouth daily for 5 days.   rivaroxaban 10 MG Tabs tablet Commonly known as: Xarelto Take 1 tablet (10 mg total) by mouth daily. For DVT prophylaxis after surgery   Vitamin D3 125 MCG (5000 UT) Tabs Take 5,000 Units by mouth daily.        Follow-up Information     Renette Butters, MD. Schedule an appointment as soon as possible for a visit in 2 week(s).   Specialty: Orthopedic Surgery Contact information: [address] [place] [address] [phone]                 Noemi Chapel, [place]

## 2021-12-11 DIAGNOSIS — Z6825 Body mass index (BMI) 25.0-25.9, adult: Secondary | ICD-10-CM | POA: Diagnosis not present

## 2021-12-11 DIAGNOSIS — E876 Hypokalemia: Secondary | ICD-10-CM | POA: Diagnosis not present

## 2021-12-11 DIAGNOSIS — I10 Essential (primary) hypertension: Secondary | ICD-10-CM | POA: Diagnosis not present

## 2021-12-11 DIAGNOSIS — M25552 Pain in left hip: Secondary | ICD-10-CM | POA: Diagnosis not present

## 2021-12-11 DIAGNOSIS — Z96642 Presence of left artificial hip joint: Secondary | ICD-10-CM | POA: Diagnosis not present

## 2021-12-14 DIAGNOSIS — M4727 Other spondylosis with radiculopathy, lumbosacral region: Secondary | ICD-10-CM | POA: Diagnosis not present

## 2021-12-14 DIAGNOSIS — M25562 Pain in left knee: Secondary | ICD-10-CM | POA: Diagnosis not present

## 2021-12-14 DIAGNOSIS — Z96652 Presence of left artificial knee joint: Secondary | ICD-10-CM | POA: Diagnosis not present

## 2021-12-14 DIAGNOSIS — Z79891 Long term (current) use of opiate analgesic: Secondary | ICD-10-CM | POA: Diagnosis not present

## 2021-12-14 DIAGNOSIS — G894 Chronic pain syndrome: Secondary | ICD-10-CM | POA: Diagnosis not present

## 2021-12-14 DIAGNOSIS — G8929 Other chronic pain: Secondary | ICD-10-CM | POA: Diagnosis not present

## 2021-12-14 DIAGNOSIS — M170 Bilateral primary osteoarthritis of knee: Secondary | ICD-10-CM | POA: Diagnosis not present

## 2021-12-14 DIAGNOSIS — Z96642 Presence of left artificial hip joint: Secondary | ICD-10-CM | POA: Diagnosis not present

## 2021-12-16 IMAGING — CR DG LUMBAR SPINE COMPLETE 4+V
5 series · 5 of 5 positions shown · non-contrast
Comparison: MRI from 08/08/2019, plain film from 08/27/2019

CLINICAL DATA: Low back pain

EXAM:
LUMBAR SPINE - COMPLETE 4+ VIEW

[t lumbar spine ap]
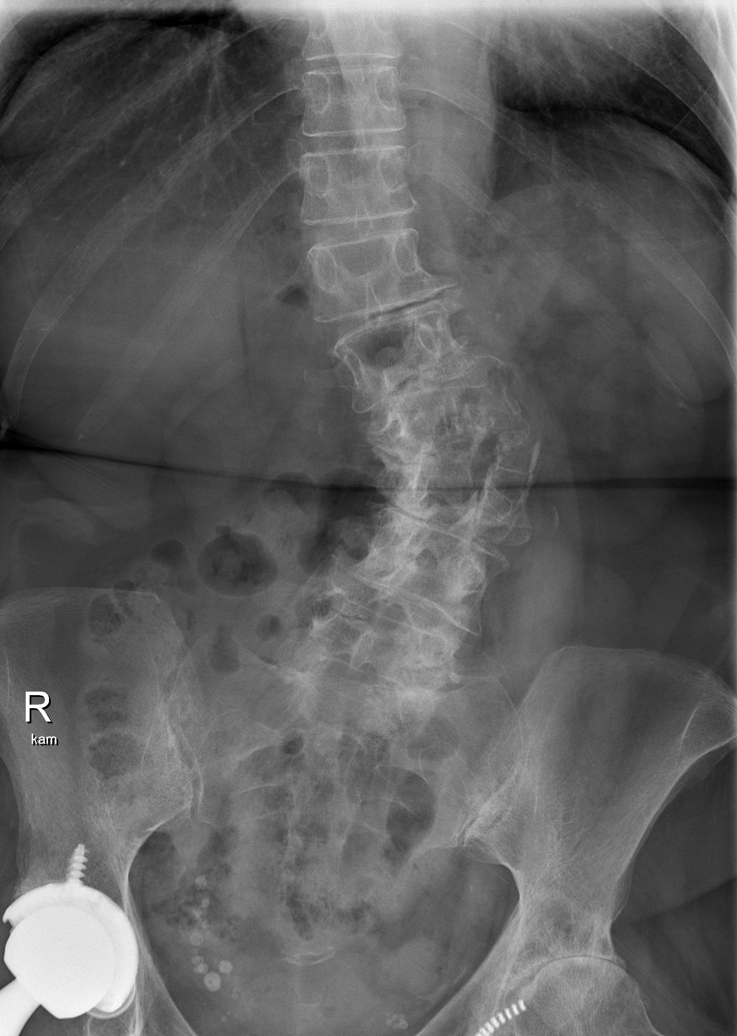

[t lumbar spine obl (1 of 2)]
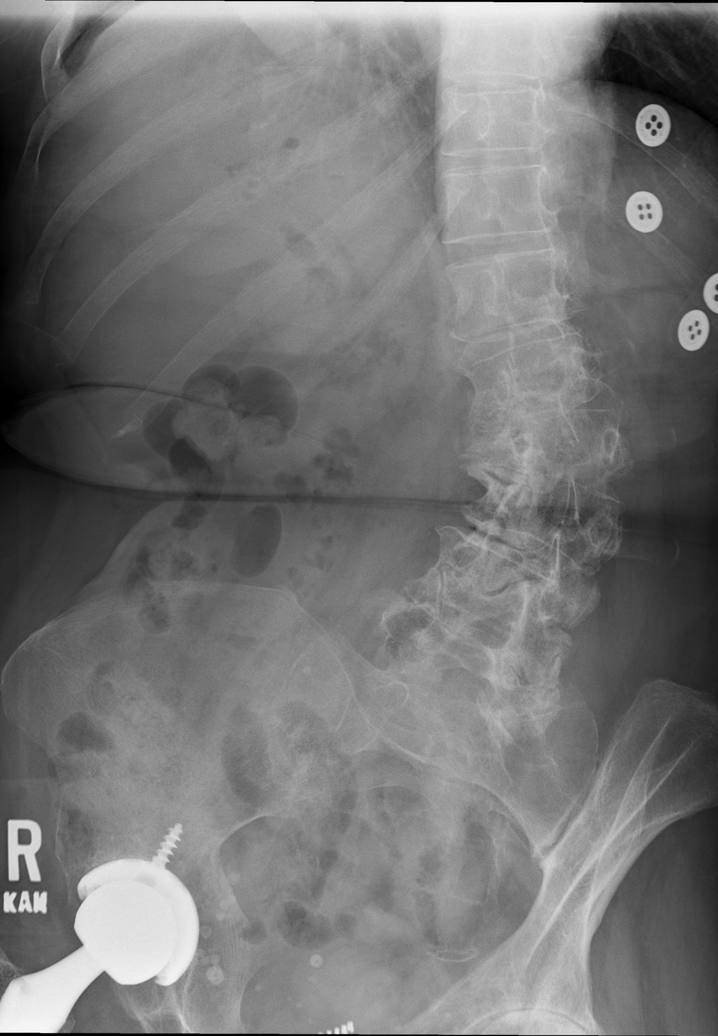

[t lumbar spine obl (2 of 2)]
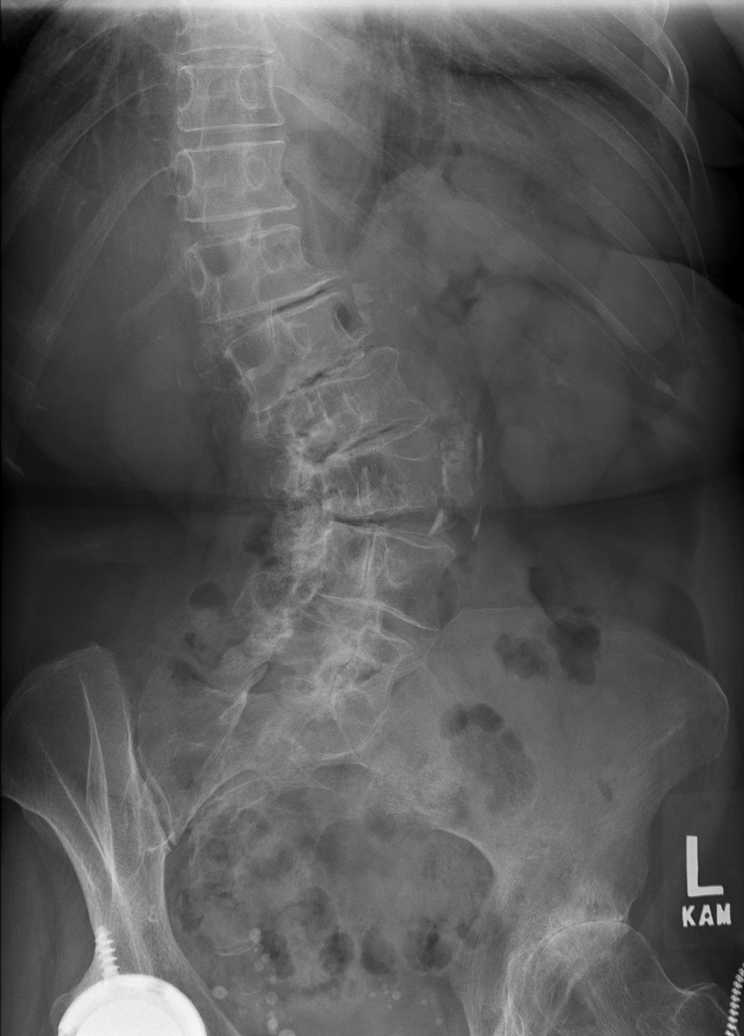

[t lumbar spine lat]
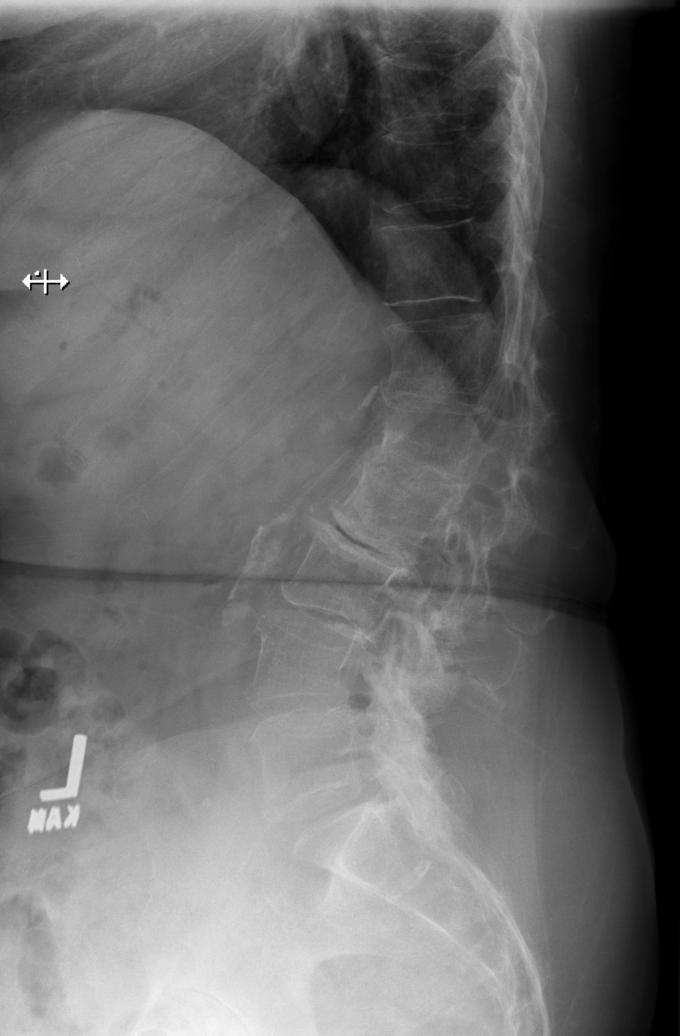

[t lumbar l-5 s-1 spot]
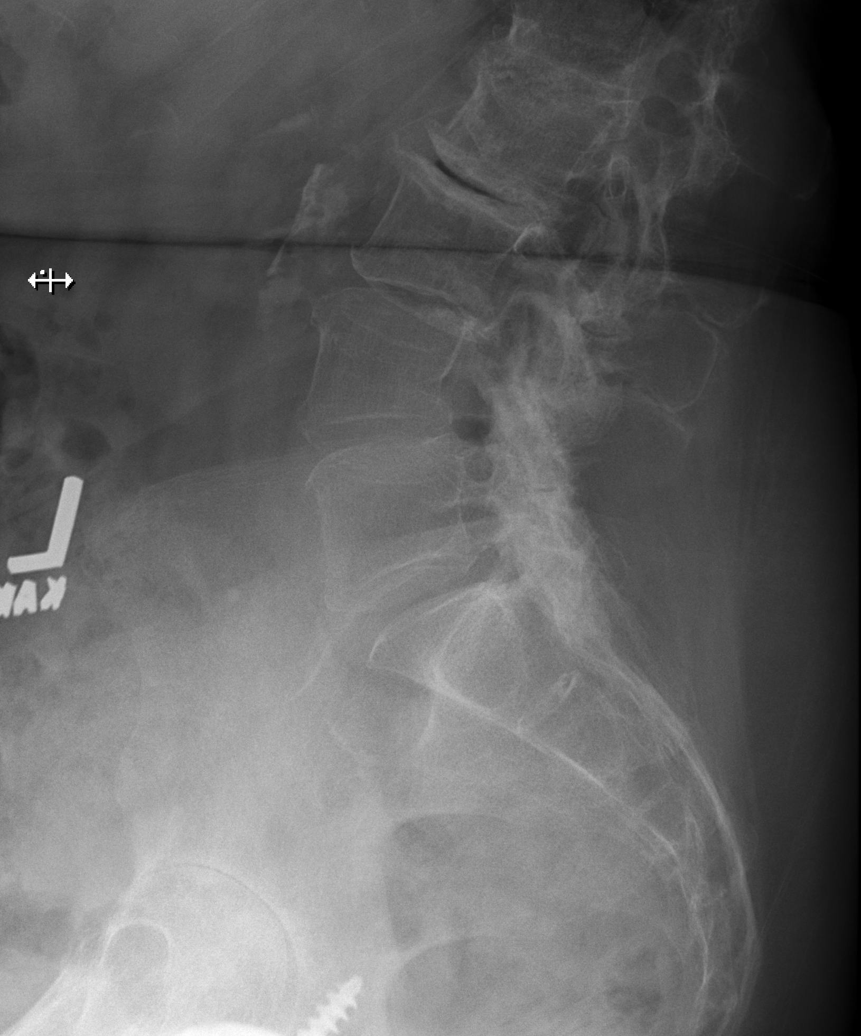

[5 of 5 positions shown; findings below may reference images not displayed]

FINDINGS: Stable scoliosis concave to the right is noted. Five lumbar type
vertebral bodies are well visualized. Osteophytic changes are seen.
Facet hypertrophic changes are noted. No definitive anterolisthesis
is seen.
IMPRESSION: Stable scoliosis and degenerative change.

## 2021-12-16 IMAGING — CR DG KNEE 3 VIEWS*L*
3 series · 3 of 3 positions shown · non-contrast
Comparison: 07/24/2019

CLINICAL DATA: Arthralgia of left knee

EXAM:
LEFT KNEE - 3 VIEW

[w knee ap left]
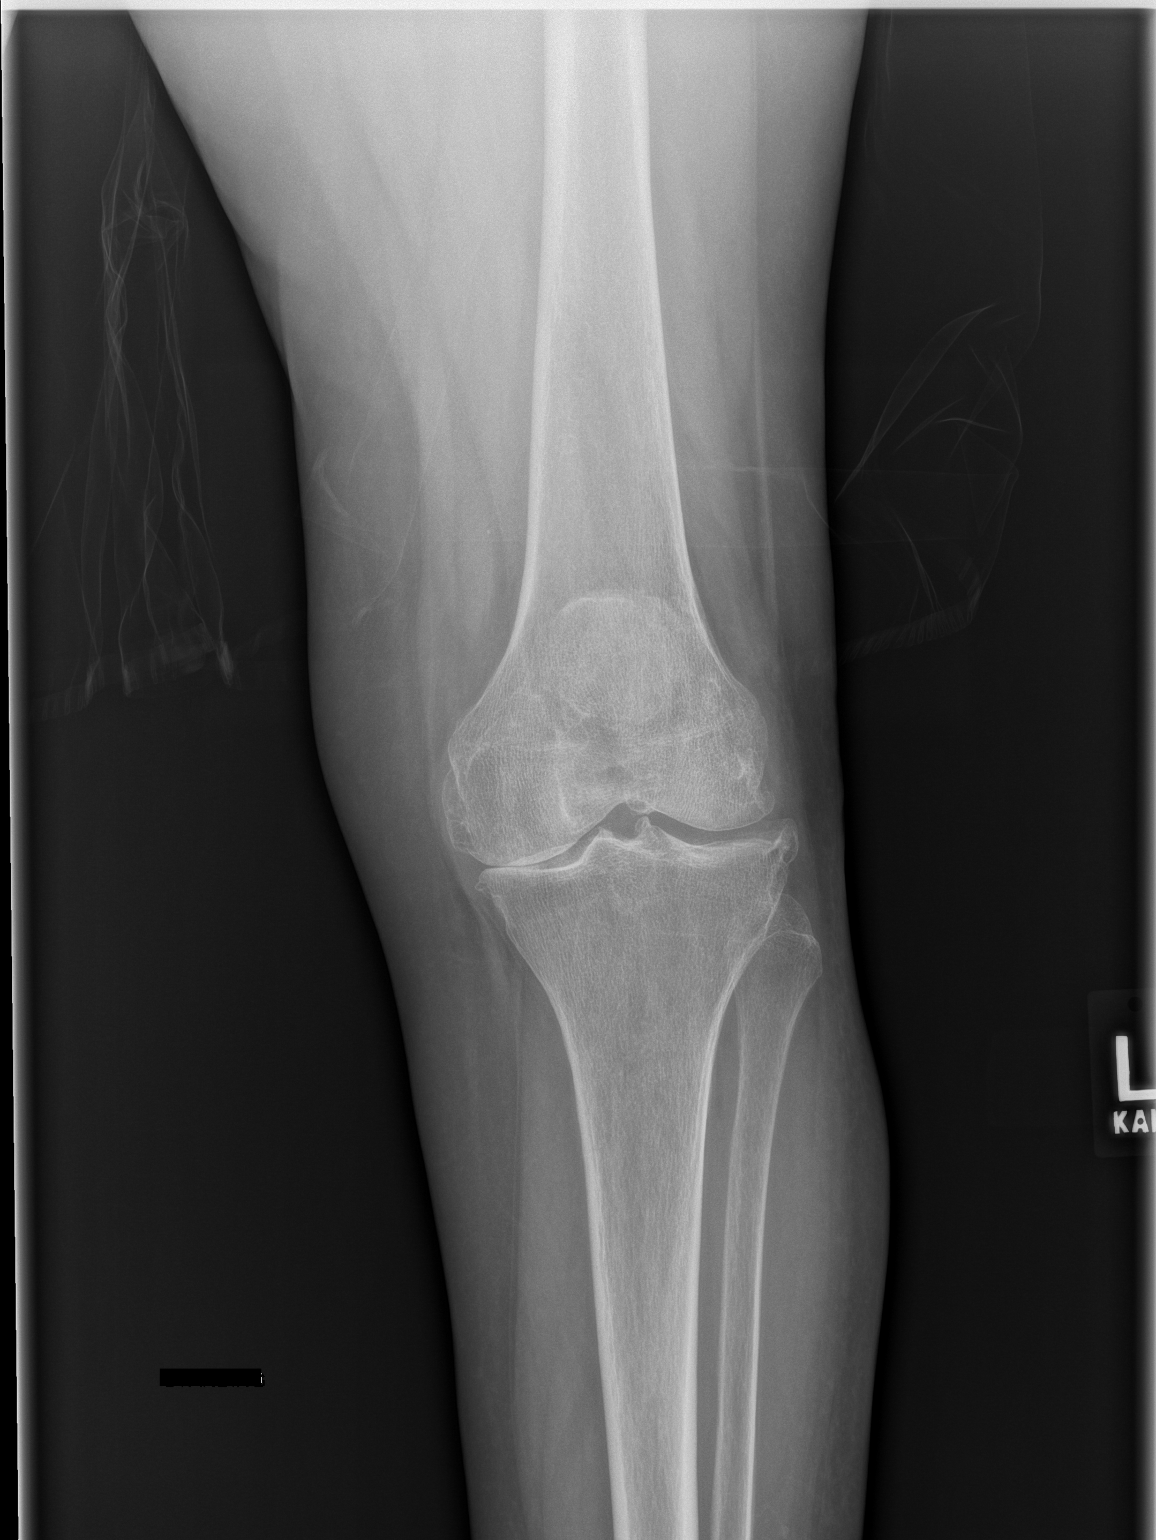

[w knee lat left]
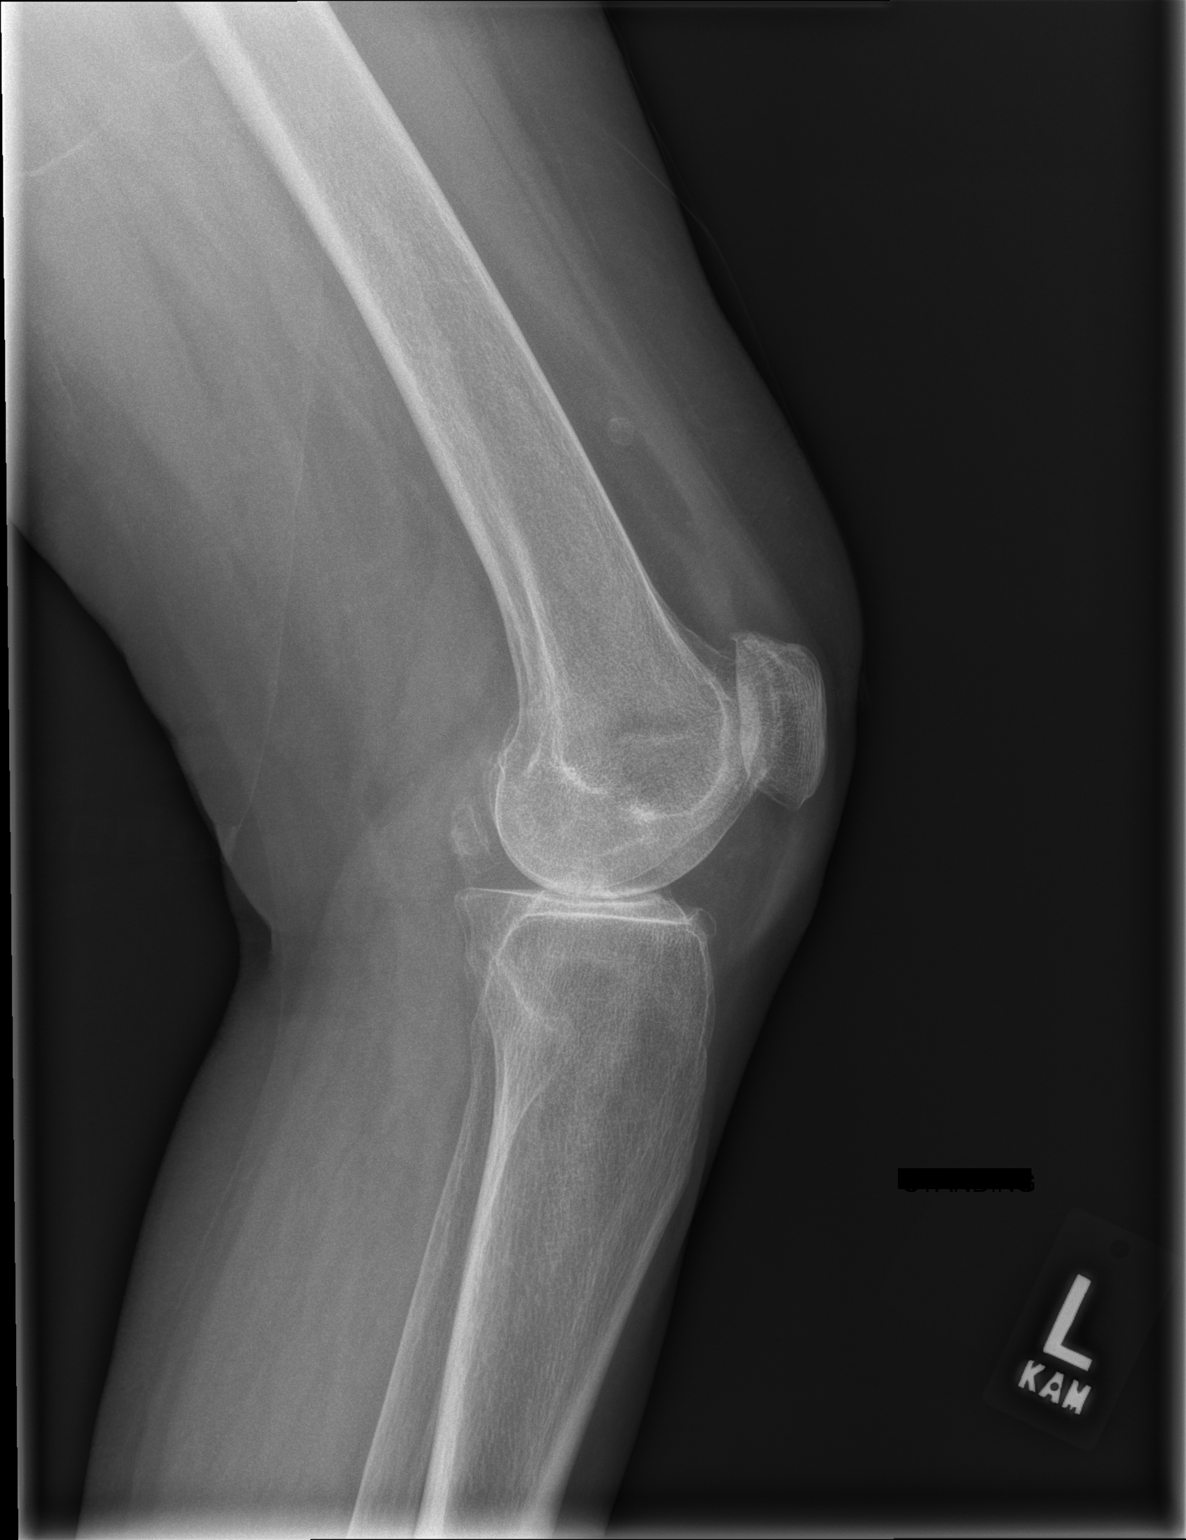

[x knee sunrise left]
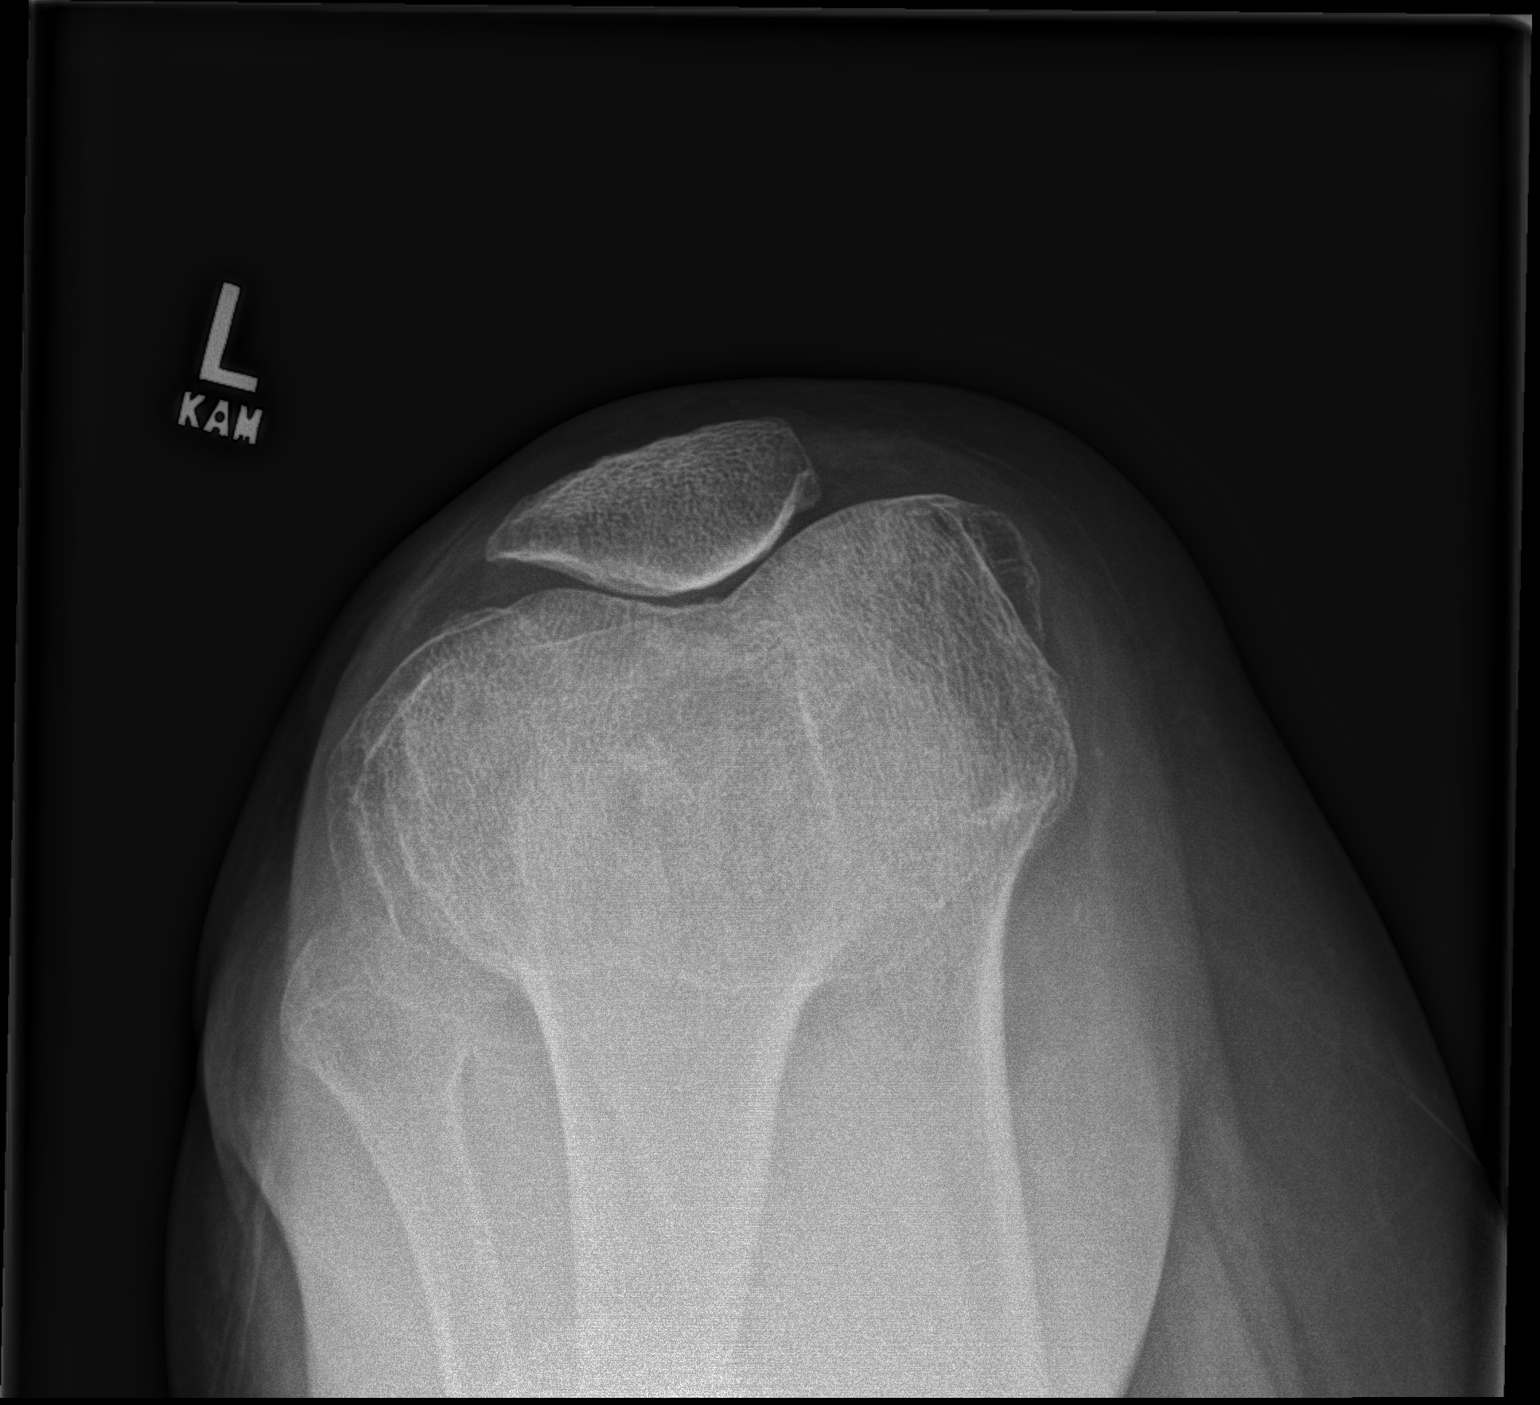

[3 of 3 positions shown; findings below may reference images not displayed]

FINDINGS: Alignment is anatomic. No acute fracture. Small joint effusion.
Tricompartmental changes of osteoarthritis. Unclear if prior study
was performed with standing, but joint space narrowing has
progressed medially.
IMPRESSION: Tricompartmental osteoarthritis. Apparent progression medially.
Small joint effusion.

## 2021-12-18 DIAGNOSIS — M1612 Unilateral primary osteoarthritis, left hip: Secondary | ICD-10-CM | POA: Diagnosis not present

## 2022-01-01 ENCOUNTER — Other Ambulatory Visit (HOSPITAL_COMMUNITY): Payer: Self-pay | Admitting: Orthopedic Surgery

## 2022-01-01 ENCOUNTER — Ambulatory Visit (HOSPITAL_COMMUNITY)
Admission: RE | Admit: 2022-01-01 | Discharge: 2022-01-01 | Disposition: A | Discharge status: Home / Self Care | Payer: Medicare HMO | Source: Ambulatory Visit | Attending: Surgery | Admitting: Surgery

## 2022-01-01 DIAGNOSIS — M79605 Pain in left leg: Secondary | ICD-10-CM | POA: Diagnosis not present

## 2022-01-01 DIAGNOSIS — M1612 Unilateral primary osteoarthritis, left hip: Secondary | ICD-10-CM | POA: Diagnosis not present

## 2022-01-06 DIAGNOSIS — Z96642 Presence of left artificial hip joint: Secondary | ICD-10-CM | POA: Diagnosis not present

## 2022-01-06 DIAGNOSIS — I10 Essential (primary) hypertension: Secondary | ICD-10-CM | POA: Diagnosis not present

## 2022-01-06 DIAGNOSIS — M25552 Pain in left hip: Secondary | ICD-10-CM | POA: Diagnosis not present

## 2022-01-09 ENCOUNTER — Ambulatory Visit (INDEPENDENT_AMBULATORY_CARE_PROVIDER_SITE_OTHER): Payer: Medicare HMO | Admitting: Vascular Surgery

## 2022-01-09 ENCOUNTER — Encounter: Payer: Self-pay | Admitting: Vascular Surgery

## 2022-01-09 VITALS — BP 159/74 | HR 55 | Temp 99.2°F | Resp 20 | Ht 60.0 in | Wt 130.0 lb

## 2022-01-09 DIAGNOSIS — I82532 Chronic embolism and thrombosis of left popliteal vein: Secondary | ICD-10-CM

## 2022-01-09 DIAGNOSIS — M7989 Other specified soft tissue disorders: Secondary | ICD-10-CM

## 2022-01-09 DIAGNOSIS — I872 Venous insufficiency (chronic) (peripheral): Secondary | ICD-10-CM | POA: Diagnosis not present

## 2022-01-09 NOTE — Progress Notes (Signed)
[facility] [place]  ASSESSMENT / PLAN: 81 y.o. 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  She has chronic venous insufficiency, left worse than right.  C4a disease.  Does not need to continue anticoagulation. I counseled the patient about the benign nature of these findings. Recommend compression and elevation for symptomatic relief. Follow up with me as needed.  CHIEF COMPLAINT: Chronic DVT  HISTORY OF PRESENT ILLNESS: Deanna Wright is a 81 y.o. female referred to clinic for evaluation of chronic deep venous thrombosis.  Her index acute deep venous thrombosis occurred after orthopedic surgery 03/21/2021.  She was appropriately started on a 17-monthcourse of anticoagulation.  Repeat scan was performed showing chronic deep venous thrombosis.  Vascular consultation was requested.  The patient is an excellent state of health given her age.  She does report some classic chronic venous insufficiency symptoms including swelling, redness, and skin changes.   Past Medical History:  Diagnosis Date   Asthma    Childhood, then some episodes 1990, No issues in 30 Years   Bile duct adenoma 060/7371  Complication of anesthesia    difficulty waking up from surgery   DJD (degenerative joint disease)    DVT (deep venous thrombosis) (HCC)    GERD (gastroesophageal reflux disease)    Heart murmur    Slight   History of colon polyps    Hypertension    OA (osteoarthritis)    Osteopenia    Scoliosis    Stomach ulcer     Past Surgical History:  Procedure Laterality Date   APPENDECTOMY     COLONOSCOPY     ENDOSCOPIC RETROGRADE CHOLANGIOPANCREATOGRAPHY (ERCP) WITH PROPOFOL N/A 01/17/2017   Procedure: ENDOSCOPIC RETROGRADE CHOLANGIOPANCREATOGRAPHY (ERCP) WITH PROPOFOL;  Surgeon: HCarol Ada MD;  Location: [facility];  Service: Endoscopy;  Laterality: N/A;   EUS N/A 01/01/2017   Procedure: UPPER ENDOSCOPIC ULTRASOUND (EUS) LINEAR;   Surgeon: HCarol Ada MD;  Location: [facility];  Service: Endoscopy;  Laterality: N/A;   KNEE SURGERY Right    TOTAL HIP ARTHROPLASTY Right 03/10/2019   Procedure: TOTAL HIP ARTHROPLASTY ANTERIOR APPROACH;  Surgeon: MRenette Butters MD;  Location: [facility];  Service: Orthopedics;  Laterality: Right;   TOTAL HIP ARTHROPLASTY Left 12/05/2021   Procedure: TOTAL HIP ARTHROPLASTY ANTERIOR APPROACH;  Surgeon: MRenette Butters MD;  Location: [facility];  Service: Orthopedics;  Laterality: Left;   TOTAL KNEE ARTHROPLASTY Left 03/21/2021   Procedure: TOTAL KNEE ARTHROPLASTY;  Surgeon: MRenette Butters MD;  Location: [facility];  Service: Orthopedics;  Laterality: Left;    History reviewed. No pertinent family history.  Social History   Socioeconomic History   Marital status: Married    Spouse name: Not on file   Number of children: Not on file   Years of education: Not on file   Highest education level: Not on file  Occupational History   Not on file  Tobacco Use   Smoking status: Never   Smokeless tobacco: Never  Vaping Use   Vaping Use: Never used  Substance and Sexual Activity   Alcohol use: No   Drug use: No   Sexual activity: Never    Birth control/protection: Post-menopausal  Other Topics Concern   Not on file  Social History Narrative   Not on file   Social Determinants of Health   Financial Resource Strain: Not on file  Food Insecurity: No Food Insecurity (12/05/2021)   Hunger Vital Sign  Worried About Charity fundraiser in the Last Year: Never true    [place] in the Last Year: Never true  Transportation Needs: No Transportation Needs (12/05/2021)   PRAPARE - Hydrologist (Medical): No    Lack of Transportation (Non-Medical): No  Physical Activity: Not on file  Stress: Not on file  Social Connections: Not on file  Intimate Partner Violence: Not At Risk (12/05/2021)   Humiliation, Afraid, Rape, and Kick questionnaire    Fear of  Current or Ex-Partner: No    Emotionally Abused: No    Physically Abused: No    Sexually Abused: No    Allergies  Allergen Reactions   Diflunisal    Diltiazem Swelling    Leg redness and swelling   Other Other (See Comments)    MD advised to not take muscle relaxers   Stadol [Butorphanol] Nausea And Vomiting and Other (See Comments)    BP bottoms out    Current Outpatient Medications  Medication Sig Dispense Refill   aspirin 81 MG chewable tablet Chew 81 mg by mouth daily.     benazepril (LOTENSIN) 40 MG tablet Take 40 mg by mouth daily.     Cholecalciferol (VITAMIN D3) 125 MCG (5000 UT) TABS Take 5,000 Units by mouth daily.     docusate calcium (SURFAK) 240 MG capsule Take 240 mg by mouth as needed for mild constipation.     hydrochlorothiazide (HYDRODIURIL) 25 MG tablet Take 25 mg by mouth daily as needed (high blood pressure).     metoprolol succinate (TOPROL-XL) 100 MG 24 hr tablet Take 100 mg by mouth every morning.  5   omeprazole (PRILOSEC) 20 MG capsule Take 20 mg by mouth daily as needed (for heartburn/indigestion.).     oxyCODONE-acetaminophen (PERCOCET) 10-325 MG tablet Take 1 tablet by mouth daily as needed for pain.     potassium chloride (KLOR-CON M) 10 MEQ tablet Take 10 mEq by mouth daily as needed (Low potassium).     docusate sodium (COLACE) 100 MG capsule Take 1 capsule (100 mg total) by mouth 2 (two) times daily. To prevent constipation while taking pain medication. (Patient not taking: Reported on 01/09/2022) 30 capsule 0   No current facility-administered medications for this visit.    PHYSICAL EXAM Vitals:   01/09/22 1058  BP: (!) 159/74  Pulse: (!) 55  Resp: 20  Temp: 99.2 F (37.3 C)  SpO2: 97%  Weight: 130 lb (59 kg)  Height: 5' (1.524 m)    Well-appearing elderly woman in no acute distress Regular rate and rhythm Unlabored breathing 2+ dorsalis pedis pulses bilaterally Left worse than right skin changes consistent with chronic venous  insufficiency.  Erythema and dyspigmentation in the left pretibial skin.  1+ pitting edema left worse than right.  PERTINENT LABORATORY AND RADIOLOGIC DATA  Most recent CBC    Latest Ref Rng & Units 12/05/2021    3:39 PM 11/22/2021   10:00 AM 03/09/2021   10:09 AM  CBC  WBC 4.0 - 10.5 K/uL 15.8  7.0  9.8   Hemoglobin 12.0 - 15.0 g/dL 10.5  10.4  11.7   Hematocrit 36.0 - 46.0 % 34.3  33.8  37.3   Platelets 150 - 400 K/uL 273  246  299      Most recent CMP    Latest Ref Rng & Units 12/05/2021    3:39 PM 11/22/2021   10:33 AM 03/09/2021   10:09 AM  CMP  Glucose 70 - 99 mg/dL  106  116   BUN 8 - 23 mg/dL  26  21   Creatinine 0.44 - 1.00 mg/dL 0.90  0.94  0.84   Sodium 135 - 145 mmol/L  140  139   Potassium 3.5 - 5.1 mmol/L  3.6  4.0   Chloride 98 - 111 mmol/L  106  104   CO2 22 - 32 mmol/L  24  27   Calcium 8.9 - 10.3 mg/dL  9.2  9.4    Previous venous complex ultrasounds performed showing acute popliteal and tibial vein DVT, followed by chronic popliteal and tibial vein DVT.  Yevonne Aline. Stanford Breed, MD FACS [facility] Phone Number: [phone] 01/09/2022 1:16 PM   Total time spent on preparing this encounter including chart review, data review, collecting history, examining the patient, coordinating care for this new patient, 45 minutes.  Portions of this report may have been transcribed using voice recognition software.  Every effort has been made to ensure accuracy; however, inadvertent computerized transcription errors may still be present.

## 2022-01-11 DIAGNOSIS — M170 Bilateral primary osteoarthritis of knee: Secondary | ICD-10-CM | POA: Diagnosis not present

## 2022-01-11 DIAGNOSIS — G894 Chronic pain syndrome: Secondary | ICD-10-CM | POA: Diagnosis not present

## 2022-01-11 DIAGNOSIS — G8929 Other chronic pain: Secondary | ICD-10-CM | POA: Diagnosis not present

## 2022-01-11 DIAGNOSIS — Z96652 Presence of left artificial knee joint: Secondary | ICD-10-CM | POA: Diagnosis not present

## 2022-01-11 DIAGNOSIS — Z79891 Long term (current) use of opiate analgesic: Secondary | ICD-10-CM | POA: Diagnosis not present

## 2022-01-11 DIAGNOSIS — M4727 Other spondylosis with radiculopathy, lumbosacral region: Secondary | ICD-10-CM | POA: Diagnosis not present

## 2022-01-11 DIAGNOSIS — M25562 Pain in left knee: Secondary | ICD-10-CM | POA: Diagnosis not present

## 2022-01-11 DIAGNOSIS — Z96642 Presence of left artificial hip joint: Secondary | ICD-10-CM | POA: Diagnosis not present

## 2022-01-12 ENCOUNTER — Encounter: Payer: Medicare HMO | Admitting: Vascular Surgery

## 2022-01-15 DIAGNOSIS — M1612 Unilateral primary osteoarthritis, left hip: Secondary | ICD-10-CM | POA: Diagnosis not present

## 2022-01-18 DIAGNOSIS — D649 Anemia, unspecified: Secondary | ICD-10-CM | POA: Diagnosis not present

## 2022-01-18 DIAGNOSIS — K219 Gastro-esophageal reflux disease without esophagitis: Secondary | ICD-10-CM | POA: Diagnosis not present

## 2022-01-18 DIAGNOSIS — I10 Essential (primary) hypertension: Secondary | ICD-10-CM | POA: Diagnosis not present

## 2022-01-18 DIAGNOSIS — I82409 Acute embolism and thrombosis of unspecified deep veins of unspecified lower extremity: Secondary | ICD-10-CM | POA: Diagnosis not present

## 2022-01-18 DIAGNOSIS — Z96642 Presence of left artificial hip joint: Secondary | ICD-10-CM | POA: Diagnosis not present

## 2022-01-18 DIAGNOSIS — Z6824 Body mass index (BMI) 24.0-24.9, adult: Secondary | ICD-10-CM | POA: Diagnosis not present

## 2022-02-02 DIAGNOSIS — Z96642 Presence of left artificial hip joint: Secondary | ICD-10-CM | POA: Diagnosis not present

## 2022-02-02 DIAGNOSIS — K219 Gastro-esophageal reflux disease without esophagitis: Secondary | ICD-10-CM | POA: Diagnosis not present

## 2022-02-02 DIAGNOSIS — I10 Essential (primary) hypertension: Secondary | ICD-10-CM | POA: Diagnosis not present

## 2022-02-02 DIAGNOSIS — Z6823 Body mass index (BMI) 23.0-23.9, adult: Secondary | ICD-10-CM | POA: Diagnosis not present

## 2022-02-02 DIAGNOSIS — I82409 Acute embolism and thrombosis of unspecified deep veins of unspecified lower extremity: Secondary | ICD-10-CM | POA: Diagnosis not present

## 2022-02-05 DIAGNOSIS — Z79891 Long term (current) use of opiate analgesic: Secondary | ICD-10-CM | POA: Diagnosis not present

## 2022-02-05 DIAGNOSIS — G894 Chronic pain syndrome: Secondary | ICD-10-CM | POA: Diagnosis not present

## 2022-02-05 DIAGNOSIS — M4727 Other spondylosis with radiculopathy, lumbosacral region: Secondary | ICD-10-CM | POA: Diagnosis not present

## 2022-02-05 DIAGNOSIS — M170 Bilateral primary osteoarthritis of knee: Secondary | ICD-10-CM | POA: Diagnosis not present

## 2022-02-05 DIAGNOSIS — Z96652 Presence of left artificial knee joint: Secondary | ICD-10-CM | POA: Diagnosis not present

## 2022-02-05 DIAGNOSIS — G8929 Other chronic pain: Secondary | ICD-10-CM | POA: Diagnosis not present

## 2022-02-05 DIAGNOSIS — M25562 Pain in left knee: Secondary | ICD-10-CM | POA: Diagnosis not present

## 2022-02-05 DIAGNOSIS — Z96642 Presence of left artificial hip joint: Secondary | ICD-10-CM | POA: Diagnosis not present

## 2022-02-08 DIAGNOSIS — I10 Essential (primary) hypertension: Secondary | ICD-10-CM | POA: Diagnosis not present

## 2022-02-20 DIAGNOSIS — Z6824 Body mass index (BMI) 24.0-24.9, adult: Secondary | ICD-10-CM | POA: Diagnosis not present

## 2022-02-20 DIAGNOSIS — Z8672 Personal history of thrombophlebitis: Secondary | ICD-10-CM | POA: Diagnosis not present

## 2022-02-20 DIAGNOSIS — Z96642 Presence of left artificial hip joint: Secondary | ICD-10-CM | POA: Diagnosis not present

## 2022-02-20 DIAGNOSIS — K219 Gastro-esophageal reflux disease without esophagitis: Secondary | ICD-10-CM | POA: Diagnosis not present

## 2022-02-20 DIAGNOSIS — I10 Essential (primary) hypertension: Secondary | ICD-10-CM | POA: Diagnosis not present

## 2022-03-01 DIAGNOSIS — K219 Gastro-esophageal reflux disease without esophagitis: Secondary | ICD-10-CM | POA: Diagnosis not present

## 2022-03-01 DIAGNOSIS — Z8672 Personal history of thrombophlebitis: Secondary | ICD-10-CM | POA: Diagnosis not present

## 2022-03-01 DIAGNOSIS — Z96642 Presence of left artificial hip joint: Secondary | ICD-10-CM | POA: Diagnosis not present

## 2022-03-01 DIAGNOSIS — Z6824 Body mass index (BMI) 24.0-24.9, adult: Secondary | ICD-10-CM | POA: Diagnosis not present

## 2022-03-01 DIAGNOSIS — I10 Essential (primary) hypertension: Secondary | ICD-10-CM | POA: Diagnosis not present

## 2022-03-06 DIAGNOSIS — M170 Bilateral primary osteoarthritis of knee: Secondary | ICD-10-CM | POA: Diagnosis not present

## 2022-03-06 DIAGNOSIS — Z79891 Long term (current) use of opiate analgesic: Secondary | ICD-10-CM | POA: Diagnosis not present

## 2022-03-06 DIAGNOSIS — M25562 Pain in left knee: Secondary | ICD-10-CM | POA: Diagnosis not present

## 2022-03-06 DIAGNOSIS — Z96642 Presence of left artificial hip joint: Secondary | ICD-10-CM | POA: Diagnosis not present

## 2022-03-06 DIAGNOSIS — M4727 Other spondylosis with radiculopathy, lumbosacral region: Secondary | ICD-10-CM | POA: Diagnosis not present

## 2022-03-06 DIAGNOSIS — G894 Chronic pain syndrome: Secondary | ICD-10-CM | POA: Diagnosis not present

## 2022-03-06 DIAGNOSIS — G8929 Other chronic pain: Secondary | ICD-10-CM | POA: Diagnosis not present

## 2022-03-06 DIAGNOSIS — Z96652 Presence of left artificial knee joint: Secondary | ICD-10-CM | POA: Diagnosis not present

## 2022-04-03 DIAGNOSIS — G8929 Other chronic pain: Secondary | ICD-10-CM | POA: Diagnosis not present

## 2022-04-03 DIAGNOSIS — M4727 Other spondylosis with radiculopathy, lumbosacral region: Secondary | ICD-10-CM | POA: Diagnosis not present

## 2022-04-03 DIAGNOSIS — M25562 Pain in left knee: Secondary | ICD-10-CM | POA: Diagnosis not present

## 2022-04-03 DIAGNOSIS — Z79891 Long term (current) use of opiate analgesic: Secondary | ICD-10-CM | POA: Diagnosis not present

## 2022-04-03 DIAGNOSIS — M170 Bilateral primary osteoarthritis of knee: Secondary | ICD-10-CM | POA: Diagnosis not present

## 2022-04-03 DIAGNOSIS — Z96642 Presence of left artificial hip joint: Secondary | ICD-10-CM | POA: Diagnosis not present

## 2022-04-03 DIAGNOSIS — Z96652 Presence of left artificial knee joint: Secondary | ICD-10-CM | POA: Diagnosis not present

## 2022-04-03 DIAGNOSIS — G894 Chronic pain syndrome: Secondary | ICD-10-CM | POA: Diagnosis not present

## 2022-04-05 DIAGNOSIS — K219 Gastro-esophageal reflux disease without esophagitis: Secondary | ICD-10-CM | POA: Diagnosis not present

## 2022-04-05 DIAGNOSIS — Z96642 Presence of left artificial hip joint: Secondary | ICD-10-CM | POA: Diagnosis not present

## 2022-04-05 DIAGNOSIS — Z6824 Body mass index (BMI) 24.0-24.9, adult: Secondary | ICD-10-CM | POA: Diagnosis not present

## 2022-04-05 DIAGNOSIS — Z8672 Personal history of thrombophlebitis: Secondary | ICD-10-CM | POA: Diagnosis not present

## 2022-04-05 DIAGNOSIS — I10 Essential (primary) hypertension: Secondary | ICD-10-CM | POA: Diagnosis not present

## 2022-04-13 DIAGNOSIS — L089 Local infection of the skin and subcutaneous tissue, unspecified: Secondary | ICD-10-CM | POA: Diagnosis not present

## 2022-04-13 DIAGNOSIS — W57XXXA Bitten or stung by nonvenomous insect and other nonvenomous arthropods, initial encounter: Secondary | ICD-10-CM | POA: Diagnosis not present

## 2022-04-13 DIAGNOSIS — S90862A Insect bite (nonvenomous), left foot, initial encounter: Secondary | ICD-10-CM | POA: Diagnosis not present

## 2022-04-13 DIAGNOSIS — Z23 Encounter for immunization: Secondary | ICD-10-CM | POA: Diagnosis not present

## 2022-05-03 DIAGNOSIS — Z96642 Presence of left artificial hip joint: Secondary | ICD-10-CM | POA: Diagnosis not present

## 2022-05-03 DIAGNOSIS — M170 Bilateral primary osteoarthritis of knee: Secondary | ICD-10-CM | POA: Diagnosis not present

## 2022-05-03 DIAGNOSIS — G894 Chronic pain syndrome: Secondary | ICD-10-CM | POA: Diagnosis not present

## 2022-05-03 DIAGNOSIS — Z79891 Long term (current) use of opiate analgesic: Secondary | ICD-10-CM | POA: Diagnosis not present

## 2022-05-03 DIAGNOSIS — M25562 Pain in left knee: Secondary | ICD-10-CM | POA: Diagnosis not present

## 2022-05-03 DIAGNOSIS — G8929 Other chronic pain: Secondary | ICD-10-CM | POA: Diagnosis not present

## 2022-05-03 DIAGNOSIS — M4727 Other spondylosis with radiculopathy, lumbosacral region: Secondary | ICD-10-CM | POA: Diagnosis not present

## 2022-05-03 DIAGNOSIS — Z96652 Presence of left artificial knee joint: Secondary | ICD-10-CM | POA: Diagnosis not present

## 2022-05-04 DIAGNOSIS — Z8672 Personal history of thrombophlebitis: Secondary | ICD-10-CM | POA: Diagnosis not present

## 2022-05-04 DIAGNOSIS — K219 Gastro-esophageal reflux disease without esophagitis: Secondary | ICD-10-CM | POA: Diagnosis not present

## 2022-05-04 DIAGNOSIS — I10 Essential (primary) hypertension: Secondary | ICD-10-CM | POA: Diagnosis not present

## 2022-05-04 DIAGNOSIS — Z96642 Presence of left artificial hip joint: Secondary | ICD-10-CM | POA: Diagnosis not present

## 2022-05-04 DIAGNOSIS — Z6824 Body mass index (BMI) 24.0-24.9, adult: Secondary | ICD-10-CM | POA: Diagnosis not present

## 2022-05-28 DIAGNOSIS — M170 Bilateral primary osteoarthritis of knee: Secondary | ICD-10-CM | POA: Diagnosis not present

## 2022-05-28 DIAGNOSIS — M25562 Pain in left knee: Secondary | ICD-10-CM | POA: Diagnosis not present

## 2022-05-28 DIAGNOSIS — M4727 Other spondylosis with radiculopathy, lumbosacral region: Secondary | ICD-10-CM | POA: Diagnosis not present

## 2022-05-28 DIAGNOSIS — G8929 Other chronic pain: Secondary | ICD-10-CM | POA: Diagnosis not present

## 2022-05-28 DIAGNOSIS — Z96642 Presence of left artificial hip joint: Secondary | ICD-10-CM | POA: Diagnosis not present

## 2022-05-28 DIAGNOSIS — Z96652 Presence of left artificial knee joint: Secondary | ICD-10-CM | POA: Diagnosis not present

## 2022-05-28 DIAGNOSIS — G894 Chronic pain syndrome: Secondary | ICD-10-CM | POA: Diagnosis not present

## 2022-05-28 DIAGNOSIS — Z79891 Long term (current) use of opiate analgesic: Secondary | ICD-10-CM | POA: Diagnosis not present

## 2022-06-01 DIAGNOSIS — Z6824 Body mass index (BMI) 24.0-24.9, adult: Secondary | ICD-10-CM | POA: Diagnosis not present

## 2022-06-01 DIAGNOSIS — I10 Essential (primary) hypertension: Secondary | ICD-10-CM | POA: Diagnosis not present

## 2022-06-01 DIAGNOSIS — K219 Gastro-esophageal reflux disease without esophagitis: Secondary | ICD-10-CM | POA: Diagnosis not present

## 2022-06-01 DIAGNOSIS — Z8672 Personal history of thrombophlebitis: Secondary | ICD-10-CM | POA: Diagnosis not present

## 2022-06-01 DIAGNOSIS — Z96642 Presence of left artificial hip joint: Secondary | ICD-10-CM | POA: Diagnosis not present

## 2022-07-02 DIAGNOSIS — G8929 Other chronic pain: Secondary | ICD-10-CM | POA: Diagnosis not present

## 2022-07-02 DIAGNOSIS — Z96652 Presence of left artificial knee joint: Secondary | ICD-10-CM | POA: Diagnosis not present

## 2022-07-02 DIAGNOSIS — Z96642 Presence of left artificial hip joint: Secondary | ICD-10-CM | POA: Diagnosis not present

## 2022-07-02 DIAGNOSIS — Z79891 Long term (current) use of opiate analgesic: Secondary | ICD-10-CM | POA: Diagnosis not present

## 2022-07-02 DIAGNOSIS — M25562 Pain in left knee: Secondary | ICD-10-CM | POA: Diagnosis not present

## 2022-07-02 DIAGNOSIS — M4727 Other spondylosis with radiculopathy, lumbosacral region: Secondary | ICD-10-CM | POA: Diagnosis not present

## 2022-07-02 DIAGNOSIS — G894 Chronic pain syndrome: Secondary | ICD-10-CM | POA: Diagnosis not present

## 2022-07-02 DIAGNOSIS — M170 Bilateral primary osteoarthritis of knee: Secondary | ICD-10-CM | POA: Diagnosis not present

## 2022-08-06 DIAGNOSIS — Z79891 Long term (current) use of opiate analgesic: Secondary | ICD-10-CM | POA: Diagnosis not present

## 2022-08-06 DIAGNOSIS — G8929 Other chronic pain: Secondary | ICD-10-CM | POA: Diagnosis not present

## 2022-08-06 DIAGNOSIS — M170 Bilateral primary osteoarthritis of knee: Secondary | ICD-10-CM | POA: Diagnosis not present

## 2022-08-06 DIAGNOSIS — Z96652 Presence of left artificial knee joint: Secondary | ICD-10-CM | POA: Diagnosis not present

## 2022-08-06 DIAGNOSIS — M4727 Other spondylosis with radiculopathy, lumbosacral region: Secondary | ICD-10-CM | POA: Diagnosis not present

## 2022-08-06 DIAGNOSIS — M25562 Pain in left knee: Secondary | ICD-10-CM | POA: Diagnosis not present

## 2022-08-06 DIAGNOSIS — Z96642 Presence of left artificial hip joint: Secondary | ICD-10-CM | POA: Diagnosis not present

## 2022-08-06 DIAGNOSIS — G894 Chronic pain syndrome: Secondary | ICD-10-CM | POA: Diagnosis not present

## 2022-08-09 DIAGNOSIS — Z96642 Presence of left artificial hip joint: Secondary | ICD-10-CM | POA: Diagnosis not present

## 2022-08-09 DIAGNOSIS — Z6824 Body mass index (BMI) 24.0-24.9, adult: Secondary | ICD-10-CM | POA: Diagnosis not present

## 2022-08-09 DIAGNOSIS — K219 Gastro-esophageal reflux disease without esophagitis: Secondary | ICD-10-CM | POA: Diagnosis not present

## 2022-08-09 DIAGNOSIS — I10 Essential (primary) hypertension: Secondary | ICD-10-CM | POA: Diagnosis not present

## 2022-08-09 DIAGNOSIS — Z8672 Personal history of thrombophlebitis: Secondary | ICD-10-CM | POA: Diagnosis not present

## 2022-08-27 DIAGNOSIS — Z96642 Presence of left artificial hip joint: Secondary | ICD-10-CM | POA: Diagnosis not present

## 2022-08-27 DIAGNOSIS — R5383 Other fatigue: Secondary | ICD-10-CM | POA: Diagnosis not present

## 2022-08-27 DIAGNOSIS — Z1321 Encounter for screening for nutritional disorder: Secondary | ICD-10-CM | POA: Diagnosis not present

## 2022-08-27 DIAGNOSIS — Z6824 Body mass index (BMI) 24.0-24.9, adult: Secondary | ICD-10-CM | POA: Diagnosis not present

## 2022-08-27 DIAGNOSIS — I10 Essential (primary) hypertension: Secondary | ICD-10-CM | POA: Diagnosis not present

## 2022-08-27 DIAGNOSIS — K219 Gastro-esophageal reflux disease without esophagitis: Secondary | ICD-10-CM | POA: Diagnosis not present

## 2022-08-27 DIAGNOSIS — Z8672 Personal history of thrombophlebitis: Secondary | ICD-10-CM | POA: Diagnosis not present

## 2022-09-05 DIAGNOSIS — M4727 Other spondylosis with radiculopathy, lumbosacral region: Secondary | ICD-10-CM | POA: Diagnosis not present

## 2022-09-05 DIAGNOSIS — Z96642 Presence of left artificial hip joint: Secondary | ICD-10-CM | POA: Diagnosis not present

## 2022-09-05 DIAGNOSIS — G894 Chronic pain syndrome: Secondary | ICD-10-CM | POA: Diagnosis not present

## 2022-09-05 DIAGNOSIS — Z79891 Long term (current) use of opiate analgesic: Secondary | ICD-10-CM | POA: Diagnosis not present

## 2022-09-05 DIAGNOSIS — G8929 Other chronic pain: Secondary | ICD-10-CM | POA: Diagnosis not present

## 2022-09-05 DIAGNOSIS — Z96652 Presence of left artificial knee joint: Secondary | ICD-10-CM | POA: Diagnosis not present

## 2022-09-05 DIAGNOSIS — M25562 Pain in left knee: Secondary | ICD-10-CM | POA: Diagnosis not present

## 2022-09-05 DIAGNOSIS — M170 Bilateral primary osteoarthritis of knee: Secondary | ICD-10-CM | POA: Diagnosis not present

## 2022-10-09 DIAGNOSIS — M25562 Pain in left knee: Secondary | ICD-10-CM | POA: Diagnosis not present

## 2022-10-09 DIAGNOSIS — Z96642 Presence of left artificial hip joint: Secondary | ICD-10-CM | POA: Diagnosis not present

## 2022-10-09 DIAGNOSIS — G8929 Other chronic pain: Secondary | ICD-10-CM | POA: Diagnosis not present

## 2022-10-09 DIAGNOSIS — G894 Chronic pain syndrome: Secondary | ICD-10-CM | POA: Diagnosis not present

## 2022-10-09 DIAGNOSIS — Z79891 Long term (current) use of opiate analgesic: Secondary | ICD-10-CM | POA: Diagnosis not present

## 2022-10-09 DIAGNOSIS — M170 Bilateral primary osteoarthritis of knee: Secondary | ICD-10-CM | POA: Diagnosis not present

## 2022-10-09 DIAGNOSIS — Z96652 Presence of left artificial knee joint: Secondary | ICD-10-CM | POA: Diagnosis not present

## 2022-10-09 DIAGNOSIS — M4727 Other spondylosis with radiculopathy, lumbosacral region: Secondary | ICD-10-CM | POA: Diagnosis not present

## 2022-10-31 DIAGNOSIS — Z6824 Body mass index (BMI) 24.0-24.9, adult: Secondary | ICD-10-CM | POA: Diagnosis not present

## 2022-10-31 DIAGNOSIS — I10 Essential (primary) hypertension: Secondary | ICD-10-CM | POA: Diagnosis not present

## 2022-10-31 DIAGNOSIS — Z8672 Personal history of thrombophlebitis: Secondary | ICD-10-CM | POA: Diagnosis not present

## 2022-10-31 DIAGNOSIS — Z96642 Presence of left artificial hip joint: Secondary | ICD-10-CM | POA: Diagnosis not present

## 2022-10-31 DIAGNOSIS — K219 Gastro-esophageal reflux disease without esophagitis: Secondary | ICD-10-CM | POA: Diagnosis not present

## 2022-11-06 DIAGNOSIS — Z96652 Presence of left artificial knee joint: Secondary | ICD-10-CM | POA: Diagnosis not present

## 2022-11-06 DIAGNOSIS — Z96642 Presence of left artificial hip joint: Secondary | ICD-10-CM | POA: Diagnosis not present

## 2022-11-06 DIAGNOSIS — M4727 Other spondylosis with radiculopathy, lumbosacral region: Secondary | ICD-10-CM | POA: Diagnosis not present

## 2022-11-06 DIAGNOSIS — M170 Bilateral primary osteoarthritis of knee: Secondary | ICD-10-CM | POA: Diagnosis not present

## 2022-11-06 DIAGNOSIS — M25562 Pain in left knee: Secondary | ICD-10-CM | POA: Diagnosis not present

## 2022-11-06 DIAGNOSIS — G894 Chronic pain syndrome: Secondary | ICD-10-CM | POA: Diagnosis not present

## 2022-11-06 DIAGNOSIS — G8929 Other chronic pain: Secondary | ICD-10-CM | POA: Diagnosis not present

## 2022-11-06 DIAGNOSIS — Z79891 Long term (current) use of opiate analgesic: Secondary | ICD-10-CM | POA: Diagnosis not present

## 2022-11-24 DIAGNOSIS — Z96642 Presence of left artificial hip joint: Secondary | ICD-10-CM | POA: Diagnosis not present

## 2022-11-24 DIAGNOSIS — I10 Essential (primary) hypertension: Secondary | ICD-10-CM | POA: Diagnosis not present

## 2022-11-24 DIAGNOSIS — Z23 Encounter for immunization: Secondary | ICD-10-CM | POA: Diagnosis not present

## 2022-11-24 DIAGNOSIS — K219 Gastro-esophageal reflux disease without esophagitis: Secondary | ICD-10-CM | POA: Diagnosis not present

## 2022-11-24 DIAGNOSIS — Z8672 Personal history of thrombophlebitis: Secondary | ICD-10-CM | POA: Diagnosis not present

## 2022-11-24 DIAGNOSIS — Z6824 Body mass index (BMI) 24.0-24.9, adult: Secondary | ICD-10-CM | POA: Diagnosis not present

## 2022-12-04 DIAGNOSIS — Z79891 Long term (current) use of opiate analgesic: Secondary | ICD-10-CM | POA: Diagnosis not present

## 2022-12-04 DIAGNOSIS — G894 Chronic pain syndrome: Secondary | ICD-10-CM | POA: Diagnosis not present

## 2022-12-04 DIAGNOSIS — Z96652 Presence of left artificial knee joint: Secondary | ICD-10-CM | POA: Diagnosis not present

## 2022-12-04 DIAGNOSIS — G8929 Other chronic pain: Secondary | ICD-10-CM | POA: Diagnosis not present

## 2022-12-04 DIAGNOSIS — M25562 Pain in left knee: Secondary | ICD-10-CM | POA: Diagnosis not present

## 2022-12-04 DIAGNOSIS — M4727 Other spondylosis with radiculopathy, lumbosacral region: Secondary | ICD-10-CM | POA: Diagnosis not present

## 2022-12-04 DIAGNOSIS — M170 Bilateral primary osteoarthritis of knee: Secondary | ICD-10-CM | POA: Diagnosis not present

## 2022-12-04 DIAGNOSIS — Z96642 Presence of left artificial hip joint: Secondary | ICD-10-CM | POA: Diagnosis not present

## 2023-01-02 DIAGNOSIS — Z79891 Long term (current) use of opiate analgesic: Secondary | ICD-10-CM | POA: Diagnosis not present

## 2023-01-02 DIAGNOSIS — M4727 Other spondylosis with radiculopathy, lumbosacral region: Secondary | ICD-10-CM | POA: Diagnosis not present

## 2023-01-02 DIAGNOSIS — Z96642 Presence of left artificial hip joint: Secondary | ICD-10-CM | POA: Diagnosis not present

## 2023-01-02 DIAGNOSIS — M25562 Pain in left knee: Secondary | ICD-10-CM | POA: Diagnosis not present

## 2023-01-02 DIAGNOSIS — Z96652 Presence of left artificial knee joint: Secondary | ICD-10-CM | POA: Diagnosis not present

## 2023-01-02 DIAGNOSIS — M170 Bilateral primary osteoarthritis of knee: Secondary | ICD-10-CM | POA: Diagnosis not present

## 2023-01-02 DIAGNOSIS — G894 Chronic pain syndrome: Secondary | ICD-10-CM | POA: Diagnosis not present

## 2023-01-02 DIAGNOSIS — G8929 Other chronic pain: Secondary | ICD-10-CM | POA: Diagnosis not present

## 2023-02-27 DIAGNOSIS — M25562 Pain in left knee: Secondary | ICD-10-CM | POA: Diagnosis not present

## 2023-02-27 DIAGNOSIS — M4727 Other spondylosis with radiculopathy, lumbosacral region: Secondary | ICD-10-CM | POA: Diagnosis not present

## 2023-02-27 DIAGNOSIS — Z96642 Presence of left artificial hip joint: Secondary | ICD-10-CM | POA: Diagnosis not present

## 2023-02-27 DIAGNOSIS — Z79891 Long term (current) use of opiate analgesic: Secondary | ICD-10-CM | POA: Diagnosis not present

## 2023-02-27 DIAGNOSIS — G8929 Other chronic pain: Secondary | ICD-10-CM | POA: Diagnosis not present

## 2023-02-27 DIAGNOSIS — Z96652 Presence of left artificial knee joint: Secondary | ICD-10-CM | POA: Diagnosis not present

## 2023-02-27 DIAGNOSIS — M170 Bilateral primary osteoarthritis of knee: Secondary | ICD-10-CM | POA: Diagnosis not present

## 2023-02-27 DIAGNOSIS — G894 Chronic pain syndrome: Secondary | ICD-10-CM | POA: Diagnosis not present

## 2023-03-24 DIAGNOSIS — S81812A Laceration without foreign body, left lower leg, initial encounter: Secondary | ICD-10-CM | POA: Diagnosis not present

## 2023-03-26 DIAGNOSIS — Z4800 Encounter for change or removal of nonsurgical wound dressing: Secondary | ICD-10-CM | POA: Diagnosis not present

## 2023-03-26 DIAGNOSIS — S81812D Laceration without foreign body, left lower leg, subsequent encounter: Secondary | ICD-10-CM | POA: Diagnosis not present

## 2023-03-26 DIAGNOSIS — Z5189 Encounter for other specified aftercare: Secondary | ICD-10-CM | POA: Diagnosis not present

## 2023-03-27 DIAGNOSIS — Z79891 Long term (current) use of opiate analgesic: Secondary | ICD-10-CM | POA: Diagnosis not present

## 2023-03-27 DIAGNOSIS — M170 Bilateral primary osteoarthritis of knee: Secondary | ICD-10-CM | POA: Diagnosis not present

## 2023-03-27 DIAGNOSIS — G8929 Other chronic pain: Secondary | ICD-10-CM | POA: Diagnosis not present

## 2023-03-27 DIAGNOSIS — M25562 Pain in left knee: Secondary | ICD-10-CM | POA: Diagnosis not present

## 2023-03-27 DIAGNOSIS — M4727 Other spondylosis with radiculopathy, lumbosacral region: Secondary | ICD-10-CM | POA: Diagnosis not present

## 2023-03-27 DIAGNOSIS — G894 Chronic pain syndrome: Secondary | ICD-10-CM | POA: Diagnosis not present

## 2023-03-27 DIAGNOSIS — Z96652 Presence of left artificial knee joint: Secondary | ICD-10-CM | POA: Diagnosis not present

## 2023-03-28 DIAGNOSIS — S81812D Laceration without foreign body, left lower leg, subsequent encounter: Secondary | ICD-10-CM | POA: Diagnosis not present

## 2023-03-28 DIAGNOSIS — Z5189 Encounter for other specified aftercare: Secondary | ICD-10-CM | POA: Diagnosis not present

## 2023-03-28 DIAGNOSIS — Z4800 Encounter for change or removal of nonsurgical wound dressing: Secondary | ICD-10-CM | POA: Diagnosis not present

## 2023-03-30 DIAGNOSIS — S81812D Laceration without foreign body, left lower leg, subsequent encounter: Secondary | ICD-10-CM | POA: Diagnosis not present

## 2023-03-30 DIAGNOSIS — Z4800 Encounter for change or removal of nonsurgical wound dressing: Secondary | ICD-10-CM | POA: Diagnosis not present

## 2023-03-30 DIAGNOSIS — Z5189 Encounter for other specified aftercare: Secondary | ICD-10-CM | POA: Diagnosis not present

## 2023-04-01 DIAGNOSIS — Z5189 Encounter for other specified aftercare: Secondary | ICD-10-CM | POA: Diagnosis not present

## 2023-04-01 DIAGNOSIS — S81812D Laceration without foreign body, left lower leg, subsequent encounter: Secondary | ICD-10-CM | POA: Diagnosis not present

## 2023-04-03 DIAGNOSIS — S81812D Laceration without foreign body, left lower leg, subsequent encounter: Secondary | ICD-10-CM | POA: Diagnosis not present

## 2023-04-05 DIAGNOSIS — S81812D Laceration without foreign body, left lower leg, subsequent encounter: Secondary | ICD-10-CM | POA: Diagnosis not present

## 2023-04-06 DIAGNOSIS — S80811D Abrasion, right lower leg, subsequent encounter: Secondary | ICD-10-CM | POA: Diagnosis not present

## 2023-04-06 DIAGNOSIS — I10 Essential (primary) hypertension: Secondary | ICD-10-CM | POA: Diagnosis not present

## 2023-04-06 DIAGNOSIS — Z6825 Body mass index (BMI) 25.0-25.9, adult: Secondary | ICD-10-CM | POA: Diagnosis not present

## 2023-04-09 DIAGNOSIS — Z5189 Encounter for other specified aftercare: Secondary | ICD-10-CM | POA: Diagnosis not present

## 2023-04-09 DIAGNOSIS — S81812D Laceration without foreign body, left lower leg, subsequent encounter: Secondary | ICD-10-CM | POA: Diagnosis not present

## 2023-04-10 DIAGNOSIS — K219 Gastro-esophageal reflux disease without esophagitis: Secondary | ICD-10-CM | POA: Diagnosis not present

## 2023-04-10 DIAGNOSIS — F419 Anxiety disorder, unspecified: Secondary | ICD-10-CM | POA: Diagnosis not present

## 2023-04-10 DIAGNOSIS — M25552 Pain in left hip: Secondary | ICD-10-CM | POA: Diagnosis not present

## 2023-04-10 DIAGNOSIS — I10 Essential (primary) hypertension: Secondary | ICD-10-CM | POA: Diagnosis not present

## 2023-04-10 DIAGNOSIS — Z6825 Body mass index (BMI) 25.0-25.9, adult: Secondary | ICD-10-CM | POA: Diagnosis not present

## 2023-04-24 DIAGNOSIS — G894 Chronic pain syndrome: Secondary | ICD-10-CM | POA: Diagnosis not present

## 2023-04-24 DIAGNOSIS — M4727 Other spondylosis with radiculopathy, lumbosacral region: Secondary | ICD-10-CM | POA: Diagnosis not present

## 2023-04-24 DIAGNOSIS — M25562 Pain in left knee: Secondary | ICD-10-CM | POA: Diagnosis not present

## 2023-04-24 DIAGNOSIS — M170 Bilateral primary osteoarthritis of knee: Secondary | ICD-10-CM | POA: Diagnosis not present

## 2023-04-24 DIAGNOSIS — Z79891 Long term (current) use of opiate analgesic: Secondary | ICD-10-CM | POA: Diagnosis not present

## 2023-04-24 DIAGNOSIS — G8929 Other chronic pain: Secondary | ICD-10-CM | POA: Diagnosis not present

## 2023-04-24 DIAGNOSIS — Z96642 Presence of left artificial hip joint: Secondary | ICD-10-CM | POA: Diagnosis not present

## 2023-04-24 DIAGNOSIS — Z96652 Presence of left artificial knee joint: Secondary | ICD-10-CM | POA: Diagnosis not present

## 2023-07-02 DIAGNOSIS — K219 Gastro-esophageal reflux disease without esophagitis: Secondary | ICD-10-CM | POA: Diagnosis not present

## 2023-07-02 DIAGNOSIS — I10 Essential (primary) hypertension: Secondary | ICD-10-CM | POA: Diagnosis not present

## 2023-07-02 DIAGNOSIS — Z6825 Body mass index (BMI) 25.0-25.9, adult: Secondary | ICD-10-CM | POA: Diagnosis not present

## 2023-07-02 DIAGNOSIS — M25552 Pain in left hip: Secondary | ICD-10-CM | POA: Diagnosis not present

## 2023-07-02 DIAGNOSIS — F419 Anxiety disorder, unspecified: Secondary | ICD-10-CM | POA: Diagnosis not present

## 2023-07-23 DIAGNOSIS — M25561 Pain in right knee: Secondary | ICD-10-CM | POA: Diagnosis not present

## 2023-07-23 DIAGNOSIS — M25552 Pain in left hip: Secondary | ICD-10-CM | POA: Diagnosis not present

## 2023-07-23 DIAGNOSIS — M5451 Vertebrogenic low back pain: Secondary | ICD-10-CM | POA: Diagnosis not present

## 2023-07-23 DIAGNOSIS — M25562 Pain in left knee: Secondary | ICD-10-CM | POA: Diagnosis not present

## 2023-07-23 DIAGNOSIS — G894 Chronic pain syndrome: Secondary | ICD-10-CM | POA: Diagnosis not present

## 2023-07-23 DIAGNOSIS — Z79891 Long term (current) use of opiate analgesic: Secondary | ICD-10-CM | POA: Diagnosis not present

## 2023-10-08 DIAGNOSIS — M25562 Pain in left knee: Secondary | ICD-10-CM | POA: Diagnosis not present

## 2023-10-08 DIAGNOSIS — Z79891 Long term (current) use of opiate analgesic: Secondary | ICD-10-CM | POA: Diagnosis not present

## 2023-10-08 DIAGNOSIS — M25561 Pain in right knee: Secondary | ICD-10-CM | POA: Diagnosis not present

## 2023-10-08 DIAGNOSIS — G894 Chronic pain syndrome: Secondary | ICD-10-CM | POA: Diagnosis not present

## 2023-10-08 DIAGNOSIS — M25552 Pain in left hip: Secondary | ICD-10-CM | POA: Diagnosis not present

## 2023-10-08 DIAGNOSIS — M5451 Vertebrogenic low back pain: Secondary | ICD-10-CM | POA: Diagnosis not present

## 2023-11-05 DIAGNOSIS — G894 Chronic pain syndrome: Secondary | ICD-10-CM | POA: Diagnosis not present

## 2023-11-05 DIAGNOSIS — M5451 Vertebrogenic low back pain: Secondary | ICD-10-CM | POA: Diagnosis not present

## 2023-11-05 DIAGNOSIS — M25562 Pain in left knee: Secondary | ICD-10-CM | POA: Diagnosis not present

## 2023-11-05 DIAGNOSIS — Z79891 Long term (current) use of opiate analgesic: Secondary | ICD-10-CM | POA: Diagnosis not present

## 2023-11-05 DIAGNOSIS — M25561 Pain in right knee: Secondary | ICD-10-CM | POA: Diagnosis not present

## 2023-11-05 DIAGNOSIS — M25552 Pain in left hip: Secondary | ICD-10-CM | POA: Diagnosis not present

## 2023-11-07 NOTE — Progress Notes (Signed)
 Deanna Wright                                          MRN: [id number]   11/07/2023   The [facility] Quality Team Specialist reviewed this patient medical record for the purposes of chart review for care gap closure. The following were reviewed: chart review for care gap closure-controlling blood pressure.    [facility] Quality Team

## 2023-12-03 DIAGNOSIS — G894 Chronic pain syndrome: Secondary | ICD-10-CM | POA: Diagnosis not present

## 2023-12-03 DIAGNOSIS — M5451 Vertebrogenic low back pain: Secondary | ICD-10-CM | POA: Diagnosis not present

## 2023-12-03 DIAGNOSIS — Z79891 Long term (current) use of opiate analgesic: Secondary | ICD-10-CM | POA: Diagnosis not present

## 2023-12-03 DIAGNOSIS — M25552 Pain in left hip: Secondary | ICD-10-CM | POA: Diagnosis not present

## 2023-12-03 DIAGNOSIS — M25561 Pain in right knee: Secondary | ICD-10-CM | POA: Diagnosis not present

## 2023-12-03 DIAGNOSIS — M25562 Pain in left knee: Secondary | ICD-10-CM | POA: Diagnosis not present
